# Patient Record
Sex: Male | Born: 2000 | Race: White | Hispanic: No | Marital: Married | Smoking: Current some day smoker
Health system: Southern US, Community
[De-identification: ages and names within clinical notes are randomized; demographics above are authoritative.]

## PROBLEM LIST (undated history)

## (undated) DIAGNOSIS — L409 Psoriasis, unspecified: Secondary | ICD-10-CM

## (undated) HISTORY — DX: Psoriasis, unspecified: L40.9

## (undated) HISTORY — PX: CIRCUMCISION: SUR203

---
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
## 2021-01-14 ENCOUNTER — Telehealth (INDEPENDENT_AMBULATORY_CARE_PROVIDER_SITE_OTHER): Payer: Self-pay | Admitting: "Endocrinology

## 2021-01-14 NOTE — Telephone Encounter (Signed)
  Who's calling (name and relationship to patient) :mom/ Mickeal Skinner   Best contact number:[phone]   Provider they see:Dr. Fransico Michael   Reason for call:mom called requesting a call back with a medical question asked to speak with clinic staff      PRESCRIPTION REFILL ONLY  Name of prescription:  Pharmacy:

## 2021-02-22 ENCOUNTER — Other Ambulatory Visit: Payer: Self-pay | Admitting: Nurse Practitioner

## 2021-02-22 ENCOUNTER — Other Ambulatory Visit: Payer: Self-pay

## 2021-02-22 ENCOUNTER — Ambulatory Visit
Admission: RE | Admit: 2021-02-22 | Discharge: 2021-02-22 | Disposition: A | Discharge status: Home / Self Care | Payer: No Typology Code available for payment source | Source: Ambulatory Visit | Attending: Nurse Practitioner | Admitting: Nurse Practitioner

## 2021-02-22 DIAGNOSIS — Z021 Encounter for pre-employment examination: Secondary | ICD-10-CM

## 2022-10-26 ENCOUNTER — Ambulatory Visit (INDEPENDENT_AMBULATORY_CARE_PROVIDER_SITE_OTHER): Payer: 59 | Admitting: Nurse Practitioner

## 2022-10-26 ENCOUNTER — Encounter: Payer: Self-pay | Admitting: Nurse Practitioner

## 2022-10-26 VITALS — BP 102/60 | HR 70 | Temp 98.7°F | Resp 16 | Ht 74.5 in | Wt 238.1 lb

## 2022-10-26 DIAGNOSIS — Z13220 Encounter for screening for lipoid disorders: Secondary | ICD-10-CM

## 2022-10-26 DIAGNOSIS — Z0000 Encounter for general adult medical examination without abnormal findings: Secondary | ICD-10-CM | POA: Insufficient documentation

## 2022-10-26 DIAGNOSIS — H6123 Impacted cerumen, bilateral: Secondary | ICD-10-CM | POA: Diagnosis not present

## 2022-10-26 DIAGNOSIS — E669 Obesity, unspecified: Secondary | ICD-10-CM | POA: Diagnosis not present

## 2022-10-26 DIAGNOSIS — L409 Psoriasis, unspecified: Secondary | ICD-10-CM | POA: Diagnosis not present

## 2022-10-26 NOTE — Assessment & Plan Note (Signed)
Do think this is secondary to muscle mass.  Patient is active with employment and exercise continue healthy lifestyle modifications

## 2022-10-26 NOTE — Assessment & Plan Note (Signed)
Historical diagnosis.  Does have been on several areas of his body.  Was on Humira and then switched to Cosentyx.  Used to be followed by [facility]. Ambulatory referral to dermatology placed today

## 2022-10-26 NOTE — Assessment & Plan Note (Signed)
Discussed age-appropriate immunizations and screening exams.  Did review patient's personal, surgical, social, family history.  Patient up-to-date on vaccinations.  He is unsure last tetanus shot inform patient if he gets bit or dirty cut it worked updated immediately.  Patient is too young for CRC screening or prostate cancer screening.  Patient was given information at discharge about preventative healthcare maintenance with anticipatory guidance.

## 2022-10-26 NOTE — Patient Instructions (Signed)
Nice to see you today I will be in touch with the labs once I have reviewed them I have made a referral to the dermatologist Follow up with me in 1 year for your next physical, sooner if you need me

## 2022-10-26 NOTE — Assessment & Plan Note (Addendum)
Verbal consent obtained.  Patient was prepped per office policy with cerumen softening eardrops.  A mixture of water and hydroperoxide were used in bilateral ears were irrigated.  Patient tolerated procedure well.  Bilateral impactions were dislodged.  Both TMs within normal limits after procedure

## 2022-10-26 NOTE — Progress Notes (Signed)
New Patient Office Visit  Subjective    Patient ID: David Hooper, male    DOB: [birth year]  Age: 22 y.o. MRN: [id number]  CC:  Chief Complaint  Patient presents with   Establish Care    HPI David Hooper presents to establish care Psorasis: was folowed by [facility] in [place]. Was on humira and then cosentyx. Has been off medications for a while   for complete physical and follow up of chronic conditions.  Immunizations: -Tetanus: Unsure -Influenza: Out of season -Shingles: Too young -Pneumonia: Too young  Diet: Fair diet. States that he eats 2-3 when he is at home. States varies at work. Snacks throughout the days. States that he will do coffee water soda and tea. Exercise:  5 times a week. States that he will do weight s and cardio. 45-60 mins   Eye exam: prn  Dental exam: needs updating   Colonoscopy: Too young, currently average risk Lung Cancer Screening: N/A  PSA: Too young, currently average risk  Sleep: works 4pm-3am. States that he will sleep 3-11am. Feels rested. Does snore   Outpatient Encounter Medications as of 10/26/2022  Medication Sig   diclofenac (VOLTAREN) 75 MG EC tablet Take 1 tablet (75 mg total) by mouth 2 (two) times daily. (Patient not taking: Reported on 05/08/2017)   No facility-administered encounter medications on file as of 10/26/2022.    Past Medical History:  Diagnosis Date   Psoriasis     Past Surgical History:  Procedure Laterality Date   CIRCUMCISION      Family History  Problem Relation Age of Onset   Diabetes Mother    Migraines Mother    Migraines Father    Anxiety disorder Father    Diabetes Brother    Bipolar disorder Paternal Aunt    Diabetes Maternal Grandmother    Migraines Other     Social History   Socioeconomic History   Marital status: Married    Spouse name: Toula Moos   Number of children: 1   Years of education: Not on file   Highest education level: Not on file  Occupational History    Not on file  Tobacco Use   Smoking status: Some Days    Types: Cigars   Smokeless tobacco: Never  Vaping Use   Vaping Use: Never used  Substance and Sexual Activity   Alcohol use: Yes    Comment: occ   Drug use: Never   Sexual activity: Yes    Birth control/protection: None  Other Topics Concern   Not on file  Social History Narrative   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  Respiratory:  Negative for shortness of breath.   Cardiovascular:  Negative for chest pain and leg swelling.  Gastrointestinal:  Negative for abdominal pain, blood in stool, constipation, diarrhea, nausea and vomiting.       BM daily   Genitourinary:  Negative for dysuria and hematuria.  Neurological:  Negative for tingling and headaches.  Psychiatric/Behavioral:  Negative for hallucinations and  suicidal ideas.         Objective    BP 102/60   Pulse 70   Temp 98.7 F (37.1 C)   Resp 16   Ht 6' 2.5" (1.892 m)   Wt 238 lb 2 oz (108 kg)   SpO2 98%   BMI 30.16 kg/m   Physical Exam Vitals and nursing note reviewed. Exam conducted with a chaperone present Tresa Endo Fiscal, CMA).  Constitutional:      Appearance: Normal appearance.  HENT:     Right Ear: Ear canal and external ear normal. There is impacted cerumen.     Left Ear: Ear canal and external ear normal. There is impacted cerumen.     Mouth/Throat:     Mouth: Mucous membranes are moist.     Pharynx: Oropharynx is clear.  Eyes:     Extraocular Movements: Extraocular movements intact.     Pupils: Pupils are equal, round, and  reactive to light.  Cardiovascular:     Rate and Rhythm: Normal rate and regular rhythm.     Pulses: Normal pulses.     Heart sounds: Normal heart sounds.  Pulmonary:     Effort: Pulmonary effort is normal.     Breath sounds: Normal breath sounds.  Abdominal:     General: Bowel sounds are normal. There is no distension.     Palpations: There is no mass.     Tenderness: There is no abdominal tenderness.     Hernia: No hernia is present. There is no hernia in the left inguinal area or right inguinal area.  Genitourinary:    Penis: Circumcised.      Testes: Normal.     Epididymis:     Right: Normal.     Left: Normal.    Musculoskeletal:     Right lower leg: No edema.     Left lower leg: No edema.  Lymphadenopathy:     Cervical: No cervical adenopathy.     Lower Body: No right inguinal adenopathy. No left inguinal adenopathy.  Skin:    General: Skin is warm.  Neurological:     General: No focal deficit present.     Mental Status: He is alert.     Deep Tendon Reflexes:     Reflex Scores:      Bicep reflexes are 2+ on the right side and 2+ on the left side.      Patellar reflexes are 2+ on the right side and 2+ on the left side.    Comments: Bilateral upper and lower extremity strength 5/5  Psychiatric:        Mood and Affect: Mood normal.        Behavior: Behavior normal.        Thought Content: Thought content normal.        Judgment: Judgment normal.         Assessment & Plan:   Problem List Items Addressed This Visit       Nervous and Auditory   Bilateral impacted cerumen    Verbal consent obtained.  Patient was prepped per office policy with cerumen softening eardrops.  A mixture of water and hydroperoxide were used in bilateral ears were irrigated.  Patient tolerated procedure well.      Relevant Orders   Ear Lavage     Musculoskeletal and Integument   Psoriasis    Historical diagnosis.  Does have been on several areas of his body.  Was on Humira and  then switched to Cosentyx.  Used to be followed by [facility]. Ambulatory referral to dermatology placed today      Relevant Orders   Ambulatory referral to Dermatology     Other   Preventative health care - Primary    Discussed age-appropriate immunizations and screening exams.  Did review patient's personal, surgical, social, family history.  Patient up-to-date on vaccinations.  He is unsure last tetanus shot inform patient if he gets bit or dirty cut it worked updated immediately.  Patient is too young for CRC screening or prostate cancer screening.  Patient was given information at discharge about preventative healthcare maintenance with anticipatory guidance.      Relevant Orders   CBC   Comprehensive metabolic panel   TSH   Obesity (BMI 30-39.9)    Do think this is secondary to muscle mass.  Patient is active with employment and exercise continue healthy lifestyle modifications      Relevant Orders   Hemoglobin A1c   Lipid panel   Other Visit Diagnoses     Screening for lipid disorders       Relevant Orders   Lipid panel       Return in about 1 year (around 10/26/2023) for CPE and Labs.   Audria Nine, NP

## 2022-10-27 LAB — COMPREHENSIVE METABOLIC PANEL
ALT: 35 U/L (ref 0–53)
AST: 23 U/L (ref 0–37)
Albumin: 4.5 g/dL (ref 3.5–5.2)
Alkaline Phosphatase: 80 U/L (ref 39–117)
BUN: 10 mg/dL (ref 6–23)
CO2: 30 mEq/L (ref 19–32)
Calcium: 9.6 mg/dL (ref 8.4–10.5)
Chloride: 100 mEq/L (ref 96–112)
Creatinine, Ser: 0.87 mg/dL (ref 0.40–1.50)
GFR: 122.41 mL/min (ref >60.00)
Glucose, Bld: 82 mg/dL (ref 70–99)
Potassium: 4.1 mEq/L (ref 3.5–5.1)
Sodium: 139 mEq/L (ref 135–145)
Total Bilirubin: 1.3 mg/dL — ABNORMAL HIGH (ref 0.2–1.2)
Total Protein: 7.4 g/dL (ref 6.0–8.3)

## 2022-10-27 LAB — LIPID PANEL
Cholesterol: 172 mg/dL (ref 0–200)
HDL: 31.7 mg/dL — ABNORMAL LOW (ref >39.00)
LDL Cholesterol: 107 mg/dL — ABNORMAL HIGH (ref 0–99)
NonHDL: 140.28
Total CHOL/HDL Ratio: 5
Triglycerides: 167 mg/dL — ABNORMAL HIGH (ref 0.0–149.0)
VLDL: 33.4 mg/dL (ref 0.0–40.0)

## 2022-10-27 LAB — CBC
HCT: 40.7 % (ref 39.0–52.0)
Hemoglobin: 14 g/dL (ref 13.0–17.0)
MCHC: 34.4 g/dL (ref 30.0–36.0)
MCV: 81.2 fl (ref 78.0–100.0)
Platelets: 280 10*3/uL (ref 150.0–400.0)
RBC: 5.01 Mil/uL (ref 4.22–5.81)
RDW: 13.8 % (ref 11.5–15.5)
WBC: 7.6 10*3/uL (ref 4.0–10.5)

## 2022-10-27 LAB — HEMOGLOBIN A1C: Hgb A1c MFr Bld: 5.4 % (ref 4.6–6.5)

## 2022-10-27 LAB — TSH: TSH: 1.28 u[IU]/mL (ref 0.35–5.50)

## 2022-10-31 ENCOUNTER — Encounter: Payer: Self-pay | Admitting: *Deleted

## 2022-12-24 IMAGING — CR DG CHEST 1V
1 series · 1 of 1 positions shown · non-contrast
Comparison: Chest radiographs 08/04/2003.

CLINICAL DATA: Pre-[REDACTED] screening examination.

EXAM:
CHEST  1 VIEW

[w chest pa]
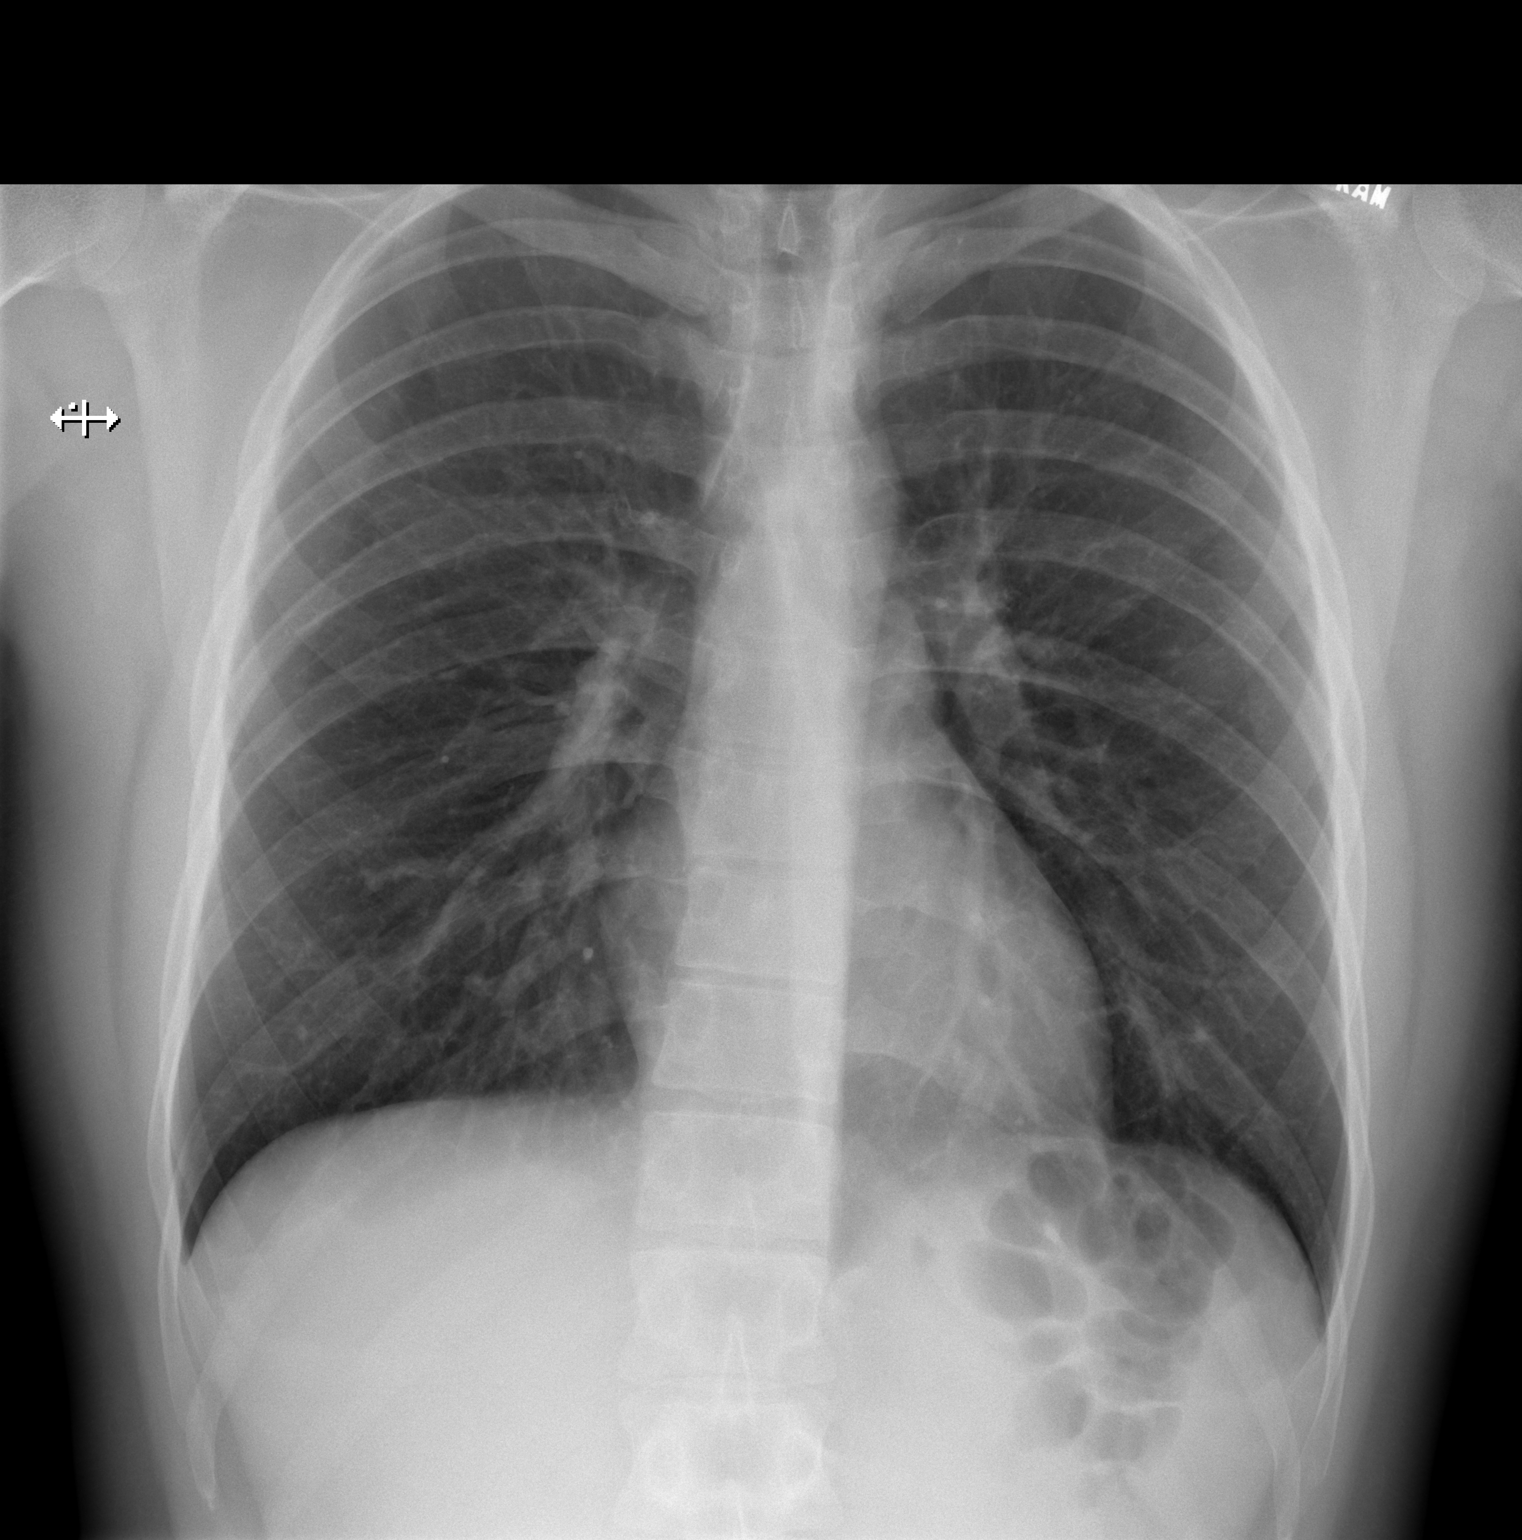

[1 of 1 positions shown; findings below may reference images not displayed]

FINDINGS: Heart size within normal limits. No appreciable airspace
consolidation. No evidence of pleural effusion or pneumothorax. No
acute bony abnormality identified.
IMPRESSION: No evidence of active cardiopulmonary disease.

## 2023-05-13 ENCOUNTER — Ambulatory Visit
Admission: EM | Admit: 2023-05-13 | Discharge: 2023-05-13 | Disposition: A | Discharge status: Home / Self Care | Payer: 59

## 2023-05-13 DIAGNOSIS — U071 COVID-19: Secondary | ICD-10-CM

## 2023-05-13 DIAGNOSIS — J111 Influenza due to unidentified influenza virus with other respiratory manifestations: Secondary | ICD-10-CM | POA: Diagnosis not present

## 2023-05-13 LAB — POC COVID19/FLU A&B COMBO
Covid Antigen, POC: POSITIVE — AB
Influenza A Antigen, POC: POSITIVE — AB
Influenza B Antigen, POC: POSITIVE — AB

## 2023-05-13 MED ORDER — PROMETHAZINE-DM 6.25-15 MG/5ML PO SYRP
5.0000 mL | ORAL_SOLUTION | Freq: Four times a day (QID) | ORAL | 0 refills | Status: DC | PRN
Start: 2023-05-13 — End: 2024-04-23

## 2023-05-13 MED ORDER — IPRATROPIUM BROMIDE 0.03 % NA SOLN: 2.0000 | Freq: Two times a day (BID) | NASAL | 0 refills | Status: DC

## 2023-05-13 NOTE — Discharge Instructions (Signed)
As we discussed, you did test positive for flu A, flu B, COVID-19.  These are viruses that typically your body will fight on their own.  If you are interested in starting Paxlovid you would need to let us know within 5 days of your symptoms.  Make sure you rest and drink plenty of fluid.  We are going to treat your symptoms.  Use Promethazine DM for cough.  This will make you sleepy so do not drive or drink alcohol taking it.  Use ipratropium nasal spray to help with the congestion.  I also recommend nasal saline and sinus rinses as well as pushing fluids.  If you are not feeling better within a few days or if anything worsens you should be seen immediately.

## 2023-05-13 NOTE — ED Provider Notes (Signed)
David Hooper    CSN: [id number] Arrival date & time: 05/13/23  1152      History   Chief Complaint Chief Complaint  Patient presents with   Cough   Sore Throat    HPI David Hooper is a 22 y.o. male.   Patient presents today with a 2-day history of URI symptoms.  Reports rhinorrhea, congestion, cough, sore throat.  Denies any chest pain, shortness of breath, fever, nausea, vomiting.  Denies any known sick contacts but was recently around many family members at Thanksgiving.  He has had COVID several years ago.  He has not had influenza vaccination this year.  He had initial COVID-19 vaccination but has not had most recent booster.  He has been taking DayQuil without improvement of symptoms.  Denies any recent antibiotics or steroids.  He does have a history of psoriasis and is on Cosentyx but denies any additional past medical history.  Denies history of allergies, asthma, COPD.  He smokes socially but infrequently.    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 Authorizing Provider  COSENTYX SENSOREADY, 300 MG, 150 MG/ML SOAJ every 30 (thirty) days. 04/18/23  Yes [provider]  ipratropium (ATROVENT) 0.03 % nasal spray Place 2 sprays into both nostrils every 12 (twelve) hours. 05/13/23  Yes Siniya Lichty K, PA-C  promethazine-dextromethorphan (PROMETHAZINE-DM) 6.25-15 MG/5ML syrup Take 5 mLs by mouth 4 (four) times daily as needed for cough. 05/13/23  Yes Annalyse Langlais, Noberto Retort, PA-C   Secukinumab (COSENTYX [place])     [provider]    Family History Family History  Problem Relation Age of Onset   Diabetes Mother    Migraines Mother    Migraines Father    Anxiety disorder Father    Diabetes Brother    Bipolar disorder Paternal Aunt    Diabetes Maternal Grandmother    Migraines Other     Social History Social History   Tobacco Use   Smoking status: Some Days    Types: Cigars   Smokeless tobacco: Never  Vaping Use   Vaping status: Never Used  Substance Use Topics   Alcohol use: Yes    Comment: Occassionally.   Drug use: Never     Allergies   Patient has no known allergies.   Review of Systems Review of Systems  Constitutional:  Positive for activity change. Negative for appetite change, fatigue and fever.  HENT:  Positive for congestion, postnasal drip and sore throat. Negative for sinus pressure and sneezing.   Respiratory:  Positive for cough. Negative for shortness of breath.   Cardiovascular:  Negative for chest pain.  Gastrointestinal:  Negative for abdominal pain, diarrhea, nausea and vomiting.     Physical Exam Triage Vital Signs ED Triage Vitals  Encounter Vitals Group     BP 05/13/23 1445 136/66     Systolic BP Percentile --      Diastolic BP Percentile --  Pulse Rate 05/13/23 1445 70     Resp 05/13/23 1445 18     Temp 05/13/23 1445 97.9 F (36.6 C)     Temp Source 05/13/23 1445 Oral     SpO2 05/13/23 1445 99 %     Weight 05/13/23 1443 235 lb (106.6 kg)     Height 05/13/23 1443 6\' 4"  (1.93 m)     Head Circumference --      Peak Flow --      Pain Score 05/13/23 1440 0     Pain Loc --      Pain Education --      Exclude from Growth Chart --    No data found.  Updated Vital Signs BP 136/66 (BP Location: Left Arm)   Pulse 70   Temp 97.9 F (36.6 C) (Oral)   Resp 18   Ht 6\' 4"  (1.93 m)   Wt 235 lb (106.6 kg)   SpO2 99%   BMI 28.61 kg/m   Visual Acuity Right Eye Distance:   Left Eye Distance:    Bilateral Distance:    Right Eye Near:   Left Eye Near:    Bilateral Near:     Physical Exam Vitals reviewed.  Constitutional:      General: He is awake.     Appearance: Normal appearance. He is well-developed. He is not ill-appearing.     Comments: Very pleasant male appears stated age in no acute distress sitting comfortably in exam room  HENT:     Head: Normocephalic and atraumatic.     Right Ear: Tympanic membrane, ear canal and external ear normal. Tympanic membrane is not erythematous or bulging.     Left Ear: Tympanic membrane, ear canal and external ear normal. Tympanic membrane is not erythematous or bulging.     Nose: Nose normal.     Mouth/Throat:     Pharynx: Uvula midline. Posterior oropharyngeal erythema present. No oropharyngeal exudate or uvula swelling.  Cardiovascular:     Rate and Rhythm: Normal rate and regular rhythm.     Heart sounds: Normal heart sounds, S1 normal and S2 normal. No murmur heard. Pulmonary:     Effort: Pulmonary effort is normal. No accessory muscle usage or respiratory distress.     Breath sounds: Normal breath sounds. No stridor. No wheezing, rhonchi or rales.     Comments: Clear to auscultation bilaterally Abdominal:     General: Bowel sounds are normal.     Palpations: Abdomen is soft.     Tenderness: There is no abdominal tenderness.  Neurological:     Mental Status: He is alert.  Psychiatric:        Behavior: Behavior is cooperative.      UC Treatments / Results  Labs (all labs ordered are listed, but only abnormal results are displayed) Labs Reviewed  POC COVID19/FLU A&B COMBO - Abnormal; Notable for the following components:      Result Value   Influenza A Antigen, POC Positive (*)    Influenza B Antigen, POC Positive (*)    Covid Antigen, POC Positive (*)    All other components within normal limits    EKG   Radiology No results found.  Procedures Procedures (including critical care time)  Medications Ordered  in UC Medications - No data to display  Initial Impression / Assessment and Plan / UC Course  I have reviewed the triage vital signs and the nursing notes.  Pertinent labs & imaging results that were available during my  care of the patient were reviewed by me and considered in my medical decision making (see chart for details).     Patient is well-appearing, afebrile, nontoxic, nontachycardic.  No evidence of acute infection on physical exam that would warrant initiation of antibiotics.  Chest x-ray was deferred as he has no adventitious lung sounds on exam his oxygen saturation was 99%.  Given short duration of symptoms COVID/flu testing was obtained.  Initially this was positive for influenza A, influenza B, COVID-19.  The control was present but we did retest him to confirm the results which again showed influenza A, influenza B, COVID-19.  We discussed potential utility of treatment with Tamiflu and Paxlovid given he does take immune modulating medication for psoriasis but after risks and benefits discussion patient preferred to defer starting these medications.  Discussed that if he is interested in starting Paxlovid within the next few days (within 5 days of symptom onset) he can contact us and we can consider calling in this prescription.  He does not require any dose adjustment of his Cosentyx with Paxlovid.  Will treat symptomatically in the meantime with Promethazine DM.  We discussed that this can be sedating and he is not drive drink alcohol with taking it.  He was also given ipratropium nasal spray for additional symptom relief as well as instruction to use nasal saline sinus rinses for additional symptoms.  He is to rest and drink plenty fluid.  Discussed that if his symptoms are improving within a week or if he has any worsening symptoms he needs to be seen immediately.  Strict return precautions given.  Work excuse note provided.  Final Clinical Impressions(s) / UC Diagnoses   Final  diagnoses:  Influenza  COVID-19     Discharge Instructions      As we discussed, you did test positive for flu A, flu B, COVID-19.  These are viruses that typically your body will fight on their own.  If you are interested in starting Paxlovid you would need to let us know within 5 days of your symptoms.  Make sure you rest and drink plenty of fluid.  We are going to treat your symptoms.  Use Promethazine DM for cough.  This will make you sleepy so do not drive or drink alcohol taking it.  Use ipratropium nasal spray to help with the congestion.  I also recommend nasal saline and sinus rinses as well as pushing fluids.  If you are not feeling better within a few days or if anything worsens you should be seen immediately.     ED Prescriptions     Medication Sig Dispense Auth. Provider   promethazine-dextromethorphan (PROMETHAZINE-DM) 6.25-15 MG/5ML syrup Take 5 mLs by mouth 4 (four) times daily as needed for cough. 118 mL Jeannemarie Sawaya K, PA-C   ipratropium (ATROVENT) 0.03 % nasal spray Place 2 sprays into both nostrils every 12 (twelve) hours. 30 mL Dajohn Ellender K, PA-C      PDMP not reviewed this encounter.   Jeani Hawking, PA-C 05/13/23 1627

## 2023-05-13 NOTE — ED Triage Notes (Signed)
"  This started with runny nose with cough about 2 days ago, went to work last night woke up today with sore throat, congestion, but I feel super cold". No flu vaccine or COVID19 vaccine. No fever (known).

## 2023-05-14 ENCOUNTER — Ambulatory Visit: Payer: 59

## 2023-07-12 ENCOUNTER — Ambulatory Visit (INDEPENDENT_AMBULATORY_CARE_PROVIDER_SITE_OTHER): Payer: 59

## 2023-07-12 ENCOUNTER — Ambulatory Visit
Admission: RE | Admit: 2023-07-12 | Discharge: 2023-07-12 | Disposition: A | Discharge status: Home / Self Care | Payer: 59 | Source: Ambulatory Visit | Attending: Emergency Medicine | Admitting: Emergency Medicine

## 2023-07-12 VITALS — BP 109/65 | HR 65 | Temp 97.8°F | Resp 18

## 2023-07-12 DIAGNOSIS — S90212A Contusion of left great toe with damage to nail, initial encounter: Secondary | ICD-10-CM

## 2023-07-12 DIAGNOSIS — M79675 Pain in left toe(s): Secondary | ICD-10-CM

## 2023-07-12 DIAGNOSIS — S92425B Nondisplaced fracture of distal phalanx of left great toe, initial encounter for open fracture: Secondary | ICD-10-CM

## 2023-07-12 MED ORDER — CEPHALEXIN 500 MG PO CAPS: 500.0000 mg | ORAL_CAPSULE | Freq: Three times a day (TID) | ORAL | 0 refills | Status: DC

## 2023-07-12 NOTE — ED Triage Notes (Signed)
Patient to Urgent Care with complaints of left sided, big toe pain. Reports that he tripped and fell yesterday walking up stairs yesterday morning. States he jammed his toe into the step.   Now having some pain and significant bruising to his left, big toe.   No hx of previous injury.

## 2023-07-12 NOTE — Discharge Instructions (Addendum)
[facility] [address], [place], [place] [address] Phone: [phone]  Take the cephalexin as directed.  Wear your walking boot as directed.  Buddy tape your toes.  Ibuprofen as directed.  Rest and elevate your foot.  Follow-up with a foot doctor such as [facility].

## 2023-07-12 NOTE — ED Provider Notes (Addendum)
David Hooper    CSN: [id number] Arrival date & time: 07/12/23  1420      History   Chief Complaint Chief Complaint  Patient presents with   Foot Injury    My husband, David Hooper, fell on his big toe yesterday. It is now bruised, he is having trouble walking in and he states that it "hurts a lot". - Entered by patient    HPI David Hooper is a 23 y.o. male.  Patient presents with pain, swelling, bruising of his left great toe after he hit it on steps yesterday.  No OTC medication taken today.  He took Tylenol yesterday.  No history of injury to this area in the past.  No numbness or weakness.  He is able to move his toe and ambulate without difficulty.  The history is provided by the patient and medical records.    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 Authorizing Provider  cephALEXin (KEFLEX) 500 MG capsule Take 1 capsule (500 mg total) by mouth 3 (three) times daily. 07/12/23  Yes Mickie Bail, NP  COSENTYX SENSOREADY, 300 MG, 150 MG/ML SOAJ every 30 (thirty) days. 04/18/23   [provider]  ipratropium (ATROVENT) 0.03 % nasal spray Place 2 sprays into both nostrils every 12 (twelve) hours. Patient not taking: Reported on 07/12/2023 05/13/23   Raspet, Noberto Retort, PA-C  promethazine-dextromethorphan (PROMETHAZINE-DM) 6.25-15 MG/5ML syrup Take 5 mLs by mouth 4 (four) times daily as needed for cough. Patient not taking: Reported on 07/12/2023 05/13/23   Raspet, Noberto Retort, PA-C  Secukinumab  (COSENTYX [place])     [provider]    Family History Family History  Problem Relation Age of Onset   Diabetes Mother    Migraines Mother    Migraines Father    Anxiety disorder Father    Diabetes Brother    Bipolar disorder Paternal Aunt    Diabetes Maternal Grandmother    Migraines Other     Social History Social History   Tobacco Use   Smoking status: Some Days    Types: Cigars   Smokeless tobacco: Never  Vaping Use   Vaping status: Never Used  Substance Use Topics   Alcohol use: Yes    Comment: Occassionally.   Drug use: Never     Allergies   Patient has no known allergies.   Review of Systems Review of Systems  Musculoskeletal:  Positive for arthralgias and joint swelling. Negative for gait problem.  Skin:  Positive for color change. Negative for wound.  Neurological:  Negative for weakness and numbness.     Physical Exam Triage Vital Signs ED Triage Vitals  Encounter Vitals Group     BP 07/12/23 1432 109/65     Systolic BP Percentile --      Diastolic BP Percentile --      Pulse Rate 07/12/23 1432 65     Resp 07/12/23 1432 18     Temp  07/12/23 1432 97.8 F (36.6 C)     Temp src --      SpO2 07/12/23 1432 96 %     Weight --      Height --      Head Circumference --      Peak Flow --      Pain Score 07/12/23 1427 2     Pain Loc --      Pain Education --      Exclude from Growth Chart --    No data found.  Updated Vital Signs BP 109/65   Pulse 65   Temp 97.8 F (36.6 C)   Resp 18   SpO2 96%   Visual Acuity Right Eye Distance:   Left Eye Distance:   Bilateral Distance:    Right Eye Near:   Left Eye Near:    Bilateral Near:     Physical Exam Constitutional:      General: He is not in acute distress. HENT:     Mouth/Throat:     Mouth: Mucous membranes are moist.  Cardiovascular:     Rate and Rhythm: Normal rate and regular rhythm.  Pulmonary:     Effort: Pulmonary effort is normal. No respiratory distress.   Musculoskeletal:        General: Swelling and tenderness present. No deformity. Normal range of motion.     Comments: Left great toe tenderness and mild edema. Dried blood and ecchymosis at base of nail.  Subungual hematoma of approximately 25% of nail.  FROM, sensation intact.  2+ pedal pulse.   Skin:    General: Skin is warm and dry.     Capillary Refill: Capillary refill takes less than 2 seconds.  Neurological:     General: No focal deficit present.     Mental Status: He is alert and oriented to person, place, and time.     Sensory: No sensory deficit.     Motor: No weakness.     Gait: Gait normal.      UC Treatments / Results  Labs (all labs ordered are listed, but only abnormal results are displayed) Labs Reviewed - No data to display  EKG   Radiology DG Toe Great Left Result Date: 07/12/2023 CLINICAL DATA:  Pain after injury yesterday. EXAM: LEFT GREAT TOE COMPARISON:  None Available. FINDINGS: Transverse fracture of the great toe distal phalanx. No significant displacement. No intra-articular involvement. Proximal digit is intact. Mild soft tissue edema. IMPRESSION: Transverse nondisplaced great toe distal phalanx fracture. Electronically Signed   By: Narda Rutherford M.D.   On: 07/12/2023 15:56    Procedures Procedures (including critical care time)  Medications Ordered in UC Medications - No data to display  Initial Impression / Assessment and Plan / UC Course  I have reviewed the triage vital signs and the nursing notes.  Pertinent labs & imaging results that were available during my care of the patient were reviewed by me and considered in my medical decision making (see chart for details).   Left great toe pain.  Xray shows nondisplaced fracture of the distal phalanx.  Because patient has dried blood at the base of his nail, treating with cephalexin.  Patient declines postop shoe.  He has a walking boot at home and prefers to wear that.  Discussed rest,  elevation, ibuprofen, ice packs.  Instructed him to follow up with a podiatrist.  Contact information for [facility] provided.  He agrees to plan of care.  Final Clinical Impressions(s) / UC Diagnoses   Final diagnoses:  Pain of left great toe  Open nondisplaced fracture of distal phalanx of left great toe, initial encounter     Discharge Instructions      [facility] [address] [place], [place] [address] Phone: [phone]  Take the cephalexin as directed.  Wear your walking boot as directed.  Buddy tape your toes.  Ibuprofen as directed.  Rest and elevate your foot.  Follow-up with a foot doctor such as [facility].      ED Prescriptions     Medication Sig Dispense Auth. Provider   cephALEXin (KEFLEX) 500 MG capsule Take 1 capsule (500 mg total) by mouth 3 (three) times daily. 21 capsule Mickie Bail, NP      PDMP not reviewed this encounter.   Mickie Bail, NP 07/12/23 1616    Mickie Bail, NP 07/12/23 [phone]

## 2023-07-24 ENCOUNTER — Ambulatory Visit (INDEPENDENT_AMBULATORY_CARE_PROVIDER_SITE_OTHER): Payer: 59

## 2023-07-24 ENCOUNTER — Ambulatory Visit: Payer: 59 | Admitting: Podiatry

## 2023-07-24 ENCOUNTER — Encounter: Payer: Self-pay | Admitting: Podiatry

## 2023-07-24 DIAGNOSIS — S92422A Displaced fracture of distal phalanx of left great toe, initial encounter for closed fracture: Secondary | ICD-10-CM | POA: Diagnosis not present

## 2023-07-24 DIAGNOSIS — S99922A Unspecified injury of left foot, initial encounter: Secondary | ICD-10-CM

## 2023-07-24 NOTE — Progress Notes (Signed)
   Chief Complaint  Patient presents with   Toe Injury    Patient states went to urgent care 2 weeks ago and they told the patient that he broke his Left hallux . No medication for, patient can walk and run on it. But it will hurt if someone steps on it    HPI: 23 y.o. male presenting today as a new patient referral from the urgent care.  Patient sustained a toe fracture to the left great toe distal phalanx 07/12/2023 when he fell down the stairs at his home.  Went to the urgent care where x-rays were taken positive for nondisplaced transverse fracture of the distal phalanx left great toe.  He has minimal pain.  He is able to ambulate just fine in tennis shoes.  He is currently on light duty for work.  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  Past Medical History:  Diagnosis Date   Psoriasis     Past Surgical History:  Procedure Laterality Date   CIRCUMCISION      No Known Allergies   Physical Exam: General: The patient is alert and oriented x3 in no acute distress.  Dermatology: Skin is warm, dry and supple bilateral lower extremities.  There is some very mild subungual hematoma underlying the left hallux nail plate but overall the nail plate is well adhered and should resolve uneventfully  Vascular: Palpable pedal pulses bilaterally. Capillary refill within normal limits.  No appreciable edema.  No erythema.  Neurological: Grossly intact via light touch  Musculoskeletal Exam: No pedal deformities noted.  There is some mild tenderness to palpation to the distal phalanx of the toe.  Radiographic Exam LT foot 07/24/2023:  Normal osseous mineralization. Joint spaces preserved.  Closed nondisplaced transverse fracture noted to the base of the distal phalanx left great toe  Assessment/Plan of Care: 1.  Transverse fracture distal phalanx left great toe; closed nondisplaced, initial encounter  -Patient evaluated.  X-rays reviewed -Currently the toe is minimally symptomatic.  It  should resolve and heal uneventfully -Patient is able to ambulate just fine in tennis shoes and go about his normal activities.  He has been light duty for the past 2 weeks -Patient may now resume full activity no restrictions beginning 07/25/2023 -Return to clinic as needed     Dot Gazella, DPM [facility]  Dr. Dot Gazella, DPM    [address] [place], [place] [address]                Office [phone]  Fax [phone]

## 2024-04-19 ENCOUNTER — Ambulatory Visit: Payer: Self-pay

## 2024-04-19 NOTE — Telephone Encounter (Signed)
 Noted

## 2024-04-19 NOTE — Telephone Encounter (Signed)
 FYI Only or Action Required?: FYI only for provider: appointment scheduled on 11/11.  Patient was last seen in primary care on 10/26/2022 by Wendee Lynwood HERO, NP.  Called Nurse Triage reporting Shoulder Pain.  Symptoms began several months ago.  Symptoms are: gradually worsening.  Triage Disposition: See PCP Within 2 Weeks  Patient/caregiver understands and will follow disposition?: Yes     Copied from CRM [id number]. Topic: Clinical - Red Word Triage >> Apr 19, 2024  1:27 PM Robinson H wrote: [facility] that prompted transfer to Nurse Triage: Shoulder pain both shoulders more in left, have had it for 6-8 months but getting worse      Reason for Disposition  Shoulder pain is a chronic symptom (recurrent or ongoing AND present > 4 weeks)  Answer Assessment - Initial Assessment Questions 1. ONSET: When did the pain start?     6-8 months ago 2. LOCATION: Where is the pain located?     Bilateral shoulder pain, left worse than right  3. PAIN: How bad is the pain? (Scale 1-10; or mild, moderate, severe)     Mild pain but goes up to moderate when lifting  4. WORK OR EXERCISE: Has there been any recent work or exercise that involved this part of the body?     Does weightlifting regularly  5. CAUSE: What do you think is causing the shoulder pain?     Unsure 6. OTHER SYMPTOMS: Do you have any other symptoms? (e.g., neck pain, swelling, rash, fever, numbness, weakness)     No  Protocols used: Shoulder Pain-A-AH

## 2024-04-23 ENCOUNTER — Ambulatory Visit: Admitting: Family Medicine

## 2024-04-23 ENCOUNTER — Encounter: Payer: Self-pay | Admitting: Family Medicine

## 2024-04-23 VITALS — BP 100/74 | HR 65 | Temp 97.9°F | Ht 76.0 in | Wt 244.0 lb

## 2024-04-23 DIAGNOSIS — G8929 Other chronic pain: Secondary | ICD-10-CM | POA: Insufficient documentation

## 2024-04-23 DIAGNOSIS — M25511 Pain in right shoulder: Secondary | ICD-10-CM | POA: Diagnosis not present

## 2024-04-23 DIAGNOSIS — L409 Psoriasis, unspecified: Secondary | ICD-10-CM | POA: Diagnosis not present

## 2024-04-23 DIAGNOSIS — Z23 Encounter for immunization: Secondary | ICD-10-CM | POA: Diagnosis not present

## 2024-04-23 DIAGNOSIS — M25512 Pain in left shoulder: Secondary | ICD-10-CM

## 2024-04-23 MED ORDER — NAPROXEN 500 MG PO TABS: 500.0000 mg | ORAL_TABLET | Freq: Two times a day (BID) | ORAL | 0 refills | Status: AC

## 2024-04-23 NOTE — Patient Instructions (Addendum)
 Flu shot today  I think you may have rotator cuff tendinopathy Take naprosyn 500mg  twice daily with meals for 1 week then as needed for inflammation/pain.  Do exercises provided today I will also refer you to physical therapy for shoulder pain.  If not improving, return to see Dr Watt sports medicine.

## 2024-04-23 NOTE — Assessment & Plan Note (Signed)
 Chronic issue, present for 6+ months.  Exam suspicious for rotator cuff tendinopathy L>R. Not consistent with shoulder bursitis or glenohumeral tear.  Rec naprosyn course, ice/heat prn, provided with exercises from [facility] [place] [facility] pt advisor on RTC injury.  Recommend limit weight lifting until after physical therapy.  Given chronicity, refer to outpatient PT.  If no improvement, rec f/u with sports medicine Dr Watt.

## 2024-04-23 NOTE — Assessment & Plan Note (Addendum)
 Sees Lupton on Cosentyx with good control. Current symptoms not consistent with psoriatic arthritis.

## 2024-04-23 NOTE — Progress Notes (Signed)
 Ph: [phone] Fax: [phone]   Patient ID: David Hooper, male    DOB: Mar 29, 2001, 23 y.o.   MRN: [id number]  This visit was conducted in person.  BP 100/74   Pulse 65   Temp 97.9 F (36.6 C) (Oral)   Ht 6' 4 (1.93 m)   Wt 244 lb (110.7 kg)   SpO2 96%   BMI 29.70 kg/m   BP Readings from Last 3 Encounters:  04/23/24 100/74  07/12/23 109/65  05/13/23 136/66   CC: shoulder pain  Subjective:   HPI: David Hooper is a 23 y.o. male presenting on 04/23/2024 for Shoulder Pain (Pt CC is Bilateral shoulder pain x 6-8 mths. Left shoulder is worse than Right shoulder. Pt states he does weight lift. Pain Scale is 4. Does have some difficulty with ROM.)   6-8 months of left > right shoulder pain without inciting trauma/injury or fall noted.  Feels ROM affected. Starts noticing stiffness with raising L shoulder past 90 degrees. Points to anterior shoulder with radiation to back. Currently pain is 0/10, at its worst 6/10.  He does regularly weight lift, pain present at 30lb weight - even with cable exercises.  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  R handed.  No redness or swelling or warmth noted to joints, no hand pain.   Notes some midline occipital neck pain as well as lower back pain - he's been seeing chiropractor with benefit.   Hasn't tried anything medications or topical or ice/heat for this.   No shooting pain down arms, numbness/tingling, weakness.   Psoriasis on Cosentyx followed by Dr Ninetta at [facility].      Relevant past medical, surgical, family and social history reviewed and updated as indicated. Interim medical history since our last visit reviewed. Allergies and medications reviewed and updated. Outpatient Medications Prior to Visit  Medication Sig Dispense Refill   COSENTYX SENSOREADY, 300 MG, 150 MG/ML SOAJ every 30 (thirty) days.     cephALEXin  (KEFLEX ) 500 MG capsule Take 1 capsule (500 mg total) by mouth 3  (three) times daily. (Patient not taking: Reported on 04/23/2024) 21 capsule 0   ipratropium (ATROVENT ) 0.03 % nasal spray Place 2 sprays into both nostrils every 12 (twelve) hours. (Patient not taking: Reported on 04/23/2024) 30 mL 0   promethazine -dextromethorphan (PROMETHAZINE -DM) 6.25-15 MG/5ML syrup Take 5 mLs by mouth 4 (four) times daily as needed for cough. (Patient not taking: Reported on 04/23/2024) 118 mL 0   Secukinumab (COSENTYX [place])  (Patient not taking: Reported on 04/23/2024)     No facility-administered medications prior to visit.     Per HPI unless specifically indicated in ROS section below Review of Systems  Objective:  BP 100/74   Pulse 65   Temp 97.9 F (36.6 C) (Oral)   Ht 6' 4 (1.93 m)   Wt 244 lb (110.7 kg)   SpO2 96%   BMI 29.70 kg/m   Wt Readings from Last 3 Encounters:  04/23/24 244 lb (110.7 kg)  05/13/23 235 lb (106.6 kg)  10/26/22 238 lb 2 oz (108 kg)      Physical Exam Vitals and nursing note reviewed.  Constitutional:      Appearance: Normal appearance. He is not ill-appearing.  Musculoskeletal:        General: Tenderness present. No swelling.     Comments:  FROM at cervical neck without pain  Bilateral shoulder exam: No deformity of shoulders on inspection. Discomfort to palpation  anterior and superior shoulder on left, no pain with palpation of shoulder bursa bilaterally FROM in abduction and forward flexion, discomfort on left with both active and passive abduction past 90 degrees. + pain with testing SITS in ext/int rotation on right. + pain with empty can sign on left. + pain with Speed test on left. Discomfort with impingement testing on left. No significant pain with crossover test. No significant pain or crepitus with rotation of humeral head in GH joint.   Skin:    General: Skin is warm and dry.     Findings: No rash.  Neurological:     Mental Status: He is alert.  Psychiatric:        Mood and Affect: Mood normal.         Behavior: Behavior normal.        Assessment & Plan:   Problem List Items Addressed This Visit     Psoriasis   Sees Lupton on Cosentyx with good control. Current symptoms not consistent with psoriatic arthritis.       Chronic pain of both shoulders - Primary   Chronic issue, present for 6+ months.  Exam suspicious for rotator cuff tendinopathy L>R. Not consistent with shoulder bursitis or glenohumeral tear.  Rec naprosyn course, ice/heat prn, provided with exercises from [facility] pt advisor on RTC injury.  Recommend limit weight lifting until after physical therapy.  Given chronicity, refer to outpatient PT.  If no improvement, rec f/u with sports medicine Dr Watt.       Relevant Medications   naproxen (NAPROSYN) 500 MG tablet   Other Relevant Orders   Ambulatory referral to Physical Therapy   Other Visit Diagnoses       Encounter for immunization       Relevant Orders   Flu vaccine trivalent PF, 6mos and older(Flulaval,Afluria,Fluarix,Fluzone) (Completed)        Meds ordered this encounter  Medications   naproxen (NAPROSYN) 500 MG tablet    Sig: Take 1 tablet (500 mg total) by mouth 2 (two) times daily with a meal.    Dispense:  30 tablet    Refill:  0    Orders Placed This Encounter  Procedures   Flu vaccine trivalent PF, 6mos and older(Flulaval,Afluria,Fluarix,Fluzone)   Ambulatory referral to Physical Therapy    Referral Priority:   Routine    Referral Type:   Physical Medicine    Referral Reason:   Specialty Services Required    Requested Specialty:   Physical Therapy    Number of Visits Requested:   1    Patient Instructions  Flu shot today  I think you may have rotator cuff tendinopathy Take naprosyn 500mg  twice daily with meals for 1 week then as needed for inflammation/pain.  Do exercises provided today I will also refer you to physical therapy for shoulder pain.  If not improving, return to see Dr Watt sports medicine.   Follow up  plan: Return if symptoms worsen or fail to improve.  Anton Blas, MD

## 2024-05-14 ENCOUNTER — Encounter: Payer: Self-pay | Admitting: Physical Therapy

## 2024-05-14 ENCOUNTER — Ambulatory Visit: Admitting: Physical Therapy

## 2024-05-14 DIAGNOSIS — M25512 Pain in left shoulder: Secondary | ICD-10-CM | POA: Diagnosis not present

## 2024-05-14 DIAGNOSIS — G8929 Other chronic pain: Secondary | ICD-10-CM

## 2024-05-14 DIAGNOSIS — M25511 Pain in right shoulder: Secondary | ICD-10-CM

## 2024-05-14 DIAGNOSIS — R293 Abnormal posture: Secondary | ICD-10-CM

## 2024-05-14 DIAGNOSIS — M6281 Muscle weakness (generalized): Secondary | ICD-10-CM

## 2024-05-14 NOTE — Therapy (Signed)
 OUTPATIENT PHYSICAL THERAPY SHOULDER EVALUATION   Patient Name: David Hooper MRN: [id number] DOB:July 20, 2000, 23 y.o., male Today's Date: 05/14/2024  END OF SESSION:  PT End of Session - 05/14/24 1053     Visit Number 1    Number of Visits 12    Date for Recertification  07/12/24    PT Start Time 0944    PT Stop Time 1038    PT Time Calculation (min) 54 min    Activity Tolerance Patient tolerated treatment well    Behavior During Therapy [facility] for tasks assessed/performed          Past Medical History:  Diagnosis Date   Psoriasis    Past Surgical History:  Procedure Laterality Date   CIRCUMCISION     Patient Active Problem List   Diagnosis Date Noted   Chronic pain of both shoulders 04/23/2024   Preventative health care 10/26/2022   Psoriasis 10/26/2022   Obesity (BMI 30-39.9) 10/26/2022   Bilateral impacted cerumen 10/26/2022   Headache 09/05/2013   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 Pt reporting pain for > 6 months. Pt reporting he has been going to chiropractor for his neck and low back pain.   PERTINENT HISTORY: Prer MD note:  Exam suspicious for rotator cuff tendinopathy L>R. Not consistent with shoulder bursitis or glenohumeral tear.  Rec naprosyn  course, ice/heat prn, provided with exercises from [facility] pt advisor on RTC injury.  Recommend limit weight lifting until after physical  therapy.  PAIN:  NPRS scale: no pain at rest, 2/10 with reaching and worse pain 7/10 Pain location: bilateral shoulder anterior Pain description: achy, sharp at times Aggravating factors: lifting sholders above head and with weights Relieving factors: pain meds, ibprofen  PRECAUTIONS: None  WEIGHT BEARING RESTRICTIONS: no  FALLS:  Has patient fallen in last 6 months? 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 0 1 2 3 4 5 6 7 8 9  10 (Date and Score)   Activity Eval  05/14/24    1. Weight lifting 25# from floor to shoulder height 7     2. Putting on vest for work 8     3.     4.    5.    Score 7.5    Total score = sum of the activity scores/number of activities Minimum detectable change (90%CI) for average score = 2 points Minimum detectable change (90%CI) for single activity score = 3 points  COGNITION: Overall cognitive status: WFL     SENSATION: WFL  POSTURE: Rounded shoulders, forward head  UPPER EXTREMITY ROM:   ROM Right Eval 05/14/24 Left Eval 05/14/24  Shoulder flexion 170 c mild pain 170 c mild pain  Shoulder extension 65 45  Shoulder abduction 145 pain began at 125 148 pain began at 130  Shoulder adduction    Shoulder internal rotation    Shoulder external rotation 90 60 c moderate pain  Elbow flexion    Elbow extension    Wrist flexion    Wrist extension    Wrist ulnar deviation    Wrist radial deviation    Wrist pronation    Wrist supination     (Blank rows = not tested)  UPPER EXTREMITY MMT:  MMT Right Eval 05/14/24 HHD in pounds Left Eval 05/14/24 HHD in pounds  Shoulder flexion 17.8 17.0  14.9 15.3  Shoulder extension    Shoulder abduction 11.8 11.6 12.7 11.5  Shoulder adduction    Shoulder internal rotation    Shoulder external rotation 21.8 19.7 20.7 23.3  Middle trapezius    Lower trapezius    Elbow flexion 54.7 54.4  Elbow extension    Wrist flexion    Wrist extension    Wrist ulnar deviation    Wrist radial deviation    Wrist pronation    Wrist supination    Grip strength (lbs)    (Blank rows = not tested)  SHOULDER SPECIAL TESTS: Rotator cuff assessment: Empty can test: positive bilateral Biceps assessment: negative bilaterally   PALPATION:  05/14/24 TTP; middle deltoid bilaterally, anterior shoulder bilaterally, bilateral upper trap tightness  TODAY'S TREATMENT:                                                                                                       DATE: 05/14/24 Therex: HEP instruction/performance c cues for techniques, handout provided.  Trial set performed of each for comprehension and symptom assessment.  See below for exercise list Self Care:  TPDN discussed with pt and handout issued Recommending to continue gym workouts working on cardio and LE Posture correction when driving and sitting   PATIENT EDUCATION: Education details: HEP, POC Person educated: Patient Education method: Programmer, Multimedia, Demonstration, Verbal cues, and Handouts Education comprehension: verbalized understanding, returned demonstration, and verbal cues required  HOME EXERCISE PROGRAM: Access Code: [email or web address] URL: [email or web address] Date: 05/14/2024 Prepared by: Delon Lunger  Exercises - Standing Shoulder Row with Anchored Resistance  - 2 x daily - 7 x weekly - 2 sets - 15 reps - 5 seconds hold - Doorway Pec Stretch at 90 Degrees Abduction  - 2 x daily - 7 x weekly - 3 sets - 10 reps - Doorway Pec Stretch at 120 Degrees Abduction  - 2 x daily - 7 x weekly - 3 sets - 10 reps - Supine Shoulder Flexion Extension AAROM with Dowel  - 2 x daily - 7 x weekly - 10 reps - Supine Shoulder External Rotation in 45 Degrees Abduction AAROM with Dowel  - 2 x daily - 7 x weekly - 10 reps - Standing Isometric Shoulder Abduction with Doorway - Arm Bent  - 2 x daily - 7 x weekly - 10 reps - 5 seconds hold  ASSESSMENT:  CLINICAL IMPRESSION: Patient is a 23 y.o. who comes to clinic with complaints of bil soulder pain with mobility, strength and movement coordination deficits that impair their ability to perform usual daily and recreational functional activities without increase difficulty/symptoms at this time.  Patient to benefit from skilled PT services to address impairments and limitations to improve to previous level of function without restriction secondary to condition.   OBJECTIVE IMPAIRMENTS: decreased mobility, decreased ROM, decreased strength, postural dysfunction, and pain.   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   REHAB POTENTIAL: Excellent  CLINICAL DECISION MAKING: Stable/uncomplicated  EVALUATION COMPLEXITY: Low   GOALS: Goals reviewed with patient? Yes  SHORT TERM GOALS: (target date for Short term goals are 3 weeks 06/04/2024)  1.Patient will demonstrate independent use of home exercise program to maintain progress from in clinic treatments. Goal status: New  LONG TERM GOALS: (target dates for all long term goals are 8 weeks  07/12/24 )   1. Patient will  demonstrate/report pain at worst less than or equal to 2/10 to facilitate minimal limitation in daily activity secondary to pain symptoms. Goal status: New   2. Patient will demonstrate independent use of home exercise program to facilitate ability to maintain/progress functional gains from skilled physical therapy services. Goal status: New   3. Patient will demonstrate Patient  specific functional scale avg > or = 9.5 to indicate reduced disability due to condition.  Goal status: New   4.  Patient will demonstrate bil UE MMT using HHD by 5 pounds  throughout to facilitate lifting, reaching, carrying at PLOF in daily activity.   Goal status: New   5.  Patient will demonstrate bil shoulder ER to >/= 80 degrees with no pain reported.    Goal status: New   6.  Pt will be able to lift 25# from floor to over head with bil UE's with no pain for returning to lifting his child and work related activities.  Goal status: New     PLAN:  PT FREQUENCY: 1-2x/week for 12 visits if needed  PT DURATION: 8 weeks  PLANNED INTERVENTIONS: Can include 02853- PT Re-evaluation, 97110-Therapeutic exercises, 97530- Therapeutic activity, [id number]- Neuromuscular re-education, 97535- Self Care, 97140- Manual therapy, [phone]- Gait training, [id number]- Orthotic Fit/training, [phone]- Canalith repositioning, [id number]- Aquatic Therapy, [phone]- Electrical stimulation (unattended), [id number] Physical performance testing, 97016- Vasopneumatic device, [id number]- Ultrasound, [id number]- Traction (mechanical), [id number]- Ionotophoresis 4mg /ml Dexamethasone,  20560 - Needle insertion w/o injection 1 or 2 muscles, 20561 - Needle insertion w/o injection 3 or more muscles.   Patient/Family education, Balance training, Stair training, Taping, Dry Needling, Joint mobilization, Joint manipulation, Spinal manipulation, Spinal mobilization, Scar mobilization, Vestibular training, Visual/preceptual remediation/compensation, DME instructions, Cryotherapy, and Moist  heat.  All performed as medically necessary.  All included unless contraindicated  PLAN FOR NEXT SESSION: Review HEP knowledge/results. Shoulder strengthening, manual as needed,  considering DN for deltoid and upper traps Measure cervical ROM    Delon JONELLE Lunger, PT, MPT 05/14/2024, 10:55 AM

## 2024-05-14 NOTE — Patient Instructions (Signed)

## 2024-05-29 ENCOUNTER — Ambulatory Visit: Admitting: Rehabilitative and Restorative Service Providers"

## 2024-05-29 ENCOUNTER — Encounter: Payer: Self-pay | Admitting: Rehabilitative and Restorative Service Providers"

## 2024-05-29 DIAGNOSIS — R293 Abnormal posture: Secondary | ICD-10-CM

## 2024-05-29 DIAGNOSIS — M25512 Pain in left shoulder: Secondary | ICD-10-CM | POA: Diagnosis not present

## 2024-05-29 DIAGNOSIS — M6281 Muscle weakness (generalized): Secondary | ICD-10-CM

## 2024-05-29 DIAGNOSIS — G8929 Other chronic pain: Secondary | ICD-10-CM

## 2024-05-29 DIAGNOSIS — M25511 Pain in right shoulder: Secondary | ICD-10-CM

## 2024-05-29 NOTE — Therapy (Signed)
 OUTPATIENT PHYSICAL THERAPY TREATMENT   Patient Name: David Hooper MRN: [id number] DOB:03-27-2001, 23 y.o., male Today's Date: 05/29/2024  END OF SESSION:  PT End of Session - 05/29/24 1051     Visit Number 2    Number of Visits 12    Date for Recertification  07/12/24    PT Start Time 1054    PT Stop Time 1134    PT Time Calculation (min) 40 min    Activity Tolerance Patient tolerated treatment well    Behavior During Therapy 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    PCP: David Lynwood HERO, NP   REFERRING PROVIDER: Rilla Baller, MD   REFERRING DIAG:  Diagnosis  M25.511,G89.29,M25.512 (ICD-10-CM) - Chronic pain of both shoulders    THERAPY DIAG:  Chronic pain of both shoulders  Muscle weakness (generalized)  Abnormal posture  Rationale for Evaluation and Treatment: Rehabilitation  ONSET DATE: + 6 months  SUBJECTIVE:                                                                                                                                                                                      SUBJECTIVE STATEMENT: Pt indicated having complaints this morning in back of neck.  Pt indicated Rt shoulder was hurting with bench.    PERTINENT HISTORY: Prer MD note:  Exam suspicious for rotator cuff tendinopathy L>R. Not consistent with shoulder bursitis or glenohumeral tear.  Rec naprosyn  course, ice/heat prn, provided with exercises from David Hooper pt advisor on RTC injury.  Recommend limit weight lifting until after physical therapy.  PAIN:   NPRS scale: 3/10 Pain location: Hooper/upper rap Pain description: achy, sharp at times Aggravating factors: lifting sholders above head and with weights Relieving factors: pain meds, ibprofen  PRECAUTIONS: None  WEIGHT BEARING RESTRICTIONS: no  FALLS:  Has patient fallen in last 6 months? 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 10 represents able to perform at prior level. 0 1 2 3 4 5 6 7 8 9  10 (Date and Score)   Activity Eval  05/14/24    1. Weight lifting 25# from floor to shoulder height 7     2. Putting on vest for work 8     3.     4.    5.    Score 7.5    Total score = sum of the activity scores/number of activities Minimum detectable change (90%CI) for average score = 2 points Minimum detectable change (90%CI) for single activity score = 3 points  COGNITION: 05/14/24 Overall cognitive status: WFL     SENSATION: 05/14/24 WFL  POSTURE: 05/14/24 Rounded shoulders, forward head  UPPER EXTREMITY ROM:   ROM Right Eval 05/14/24 Left Eval 05/14/24  Shoulder flexion 170 c mild pain 170 c mild pain  Shoulder extension 65 45  Shoulder abduction 145 pain began at 125 148 pain began at 130  Shoulder adduction    Shoulder internal rotation    Shoulder external rotation 90 60 c moderate pain  Elbow flexion    Elbow extension    Wrist flexion    Wrist extension    Wrist ulnar deviation    Wrist radial deviation    Wrist pronation    Wrist supination    (Blank rows = not tested)  UPPER  EXTREMITY MMT:  MMT Right Eval 05/14/24 HHD in pounds Left Eval 05/14/24 HHD in pounds  Shoulder flexion 17.8 17.0  14.9 15.3  Shoulder extension    Shoulder abduction 11.8 11.6 12.7 11.5  Shoulder adduction    Shoulder internal rotation    Shoulder external rotation 21.8 19.7 20.7 23.3  Middle trapezius    Lower trapezius    Elbow flexion 54.7 54.4  Elbow extension    Wrist flexion    Wrist extension    Wrist ulnar deviation    Wrist radial deviation    Wrist pronation    Wrist supination    Grip strength (lbs)    (Blank rows = not tested)  SHOULDER SPECIAL TESTS: 05/14/24 Rotator cuff assessment: Empty can test: positive bilateral Biceps assessment: negative bilaterally   PALPATION:  05/29/2024: Thoracic cPA hypomobility mild with pain symptoms T5, T6, T7  05/14/24 TTP; middle deltoid bilaterally, anterior shoulder bilaterally, bilateral upper trap tightness  TODAY'S TREATMENT:                                                                                                       DATE:  05/29/2024 Manual cPA T4-T9 G3-G4.  Regional PA manip G5 x 2 to mid thoracic Percussive device to mid and upper thoracic paraspinals, bilateral upper trap.   Neuro Re-Ed UBE UE only fwd/back 3 mins each way for muscle activation, mobility.  Lvl 3.5  Prone Scapular Retraction  10 reps - 5 hold Prone Scapular Slide with Shoulder Extension  10 reps - 5 hold Prone Scapular Retraction Arms at Side 10 reps - 5 hold Prone Scapular Retraction Y  10 reps - 5 hold Prone Scapular Protraction Retraction AROM on Forearms   10 reps - 5 hold Seated cervical retraction isometric activation against wall 5 sec hold x 10  Time spent in education of cues and techniques.    TODAY'S TREATMENT:                                                                                                        DATE: 05/14/24 Therex: HEP instruction/performance c cues for techniques, handout provided.  Trial set performed of each for comprehension and symptom assessment.  See below for exercise list Self Care:  TPDN discussed with pt and handout issued Recommending to continue gym workouts working on cardio and LE Posture correction when driving and sitting   PATIENT EDUCATION: 05/29/2024 Education details: HEP update Person educated: Patient Education method: Programmer, Multimedia, Demonstration, Verbal cues, and Handouts Education comprehension: verbalized understanding, returned demonstration, and verbal cues required  HOME EXERCISE PROGRAM: Access Code: [email or web address] URL: [email or web address] Date: 05/29/2024 Prepared by: Ozell Silvan  Exercises - Standing Shoulder Row with Anchored Resistance  - 2 x daily - 7 x weekly - 2 sets - 15 reps - 5 seconds hold - Doorway Pec Stretch at 90 Degrees Abduction  - 2 x daily - 7 x weekly - 3 sets - 10 reps - Doorway Pec Stretch at 120 Degrees Abduction  - 2 x daily - 7 x weekly - 3 sets - 10 reps - Supine Shoulder Flexion Extension AAROM with Dowel  - 2 x daily - 7 x weekly - 10 reps - Supine Shoulder External Rotation in 45 Degrees Abduction AAROM with Dowel  - 2 x daily - 7 x weekly - 10 reps - Standing Isometric Shoulder Abduction with Doorway - Arm Bent  - 2 x daily - 7 x weekly - 10 reps - 5 seconds hold - Prone Scapular Retraction  - 1 x daily - 7 x weekly - 1 sets - 10 reps -  5 hold - Prone Scapular Slide with Shoulder Extension  - 1 x daily - 7 x weekly - 1 sets - 10 reps - 5 hold - Prone Scapular Retraction Arms at Side  - 1 x daily - 7 x weekly - 1 sets - 10 reps - 5 hold - Prone Scapular Retraction Y  - 1 x daily - 7 x weekly - 1 sets - 10 reps - 5 hold - Prone Scapular Protraction Retraction AROM on Forearms  - 1 x daily - 7 x weekly - 3 sets - 10  reps - 5 hold  ASSESSMENT:  CLINICAL IMPRESSION: Mild thoracic hypomobility noted in mid and upper thoracic with pain located with testing that connected to concordant symptoms in area today. Progressed HEP to include periscapular activation/endurance improvements.   Continued skilled PT services indicated to address shoulder pains and cervicothoracic pain(interconnection of areas noted).   OBJECTIVE IMPAIRMENTS: decreased mobility, decreased ROM, decreased strength, postural dysfunction, and pain.   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   REHAB POTENTIAL: Excellent  CLINICAL DECISION MAKING: Stable/uncomplicated  EVALUATION COMPLEXITY: Low   GOALS: Goals reviewed with patient? Yes  SHORT TERM GOALS: (target date for Short term goals are 3 weeks 06/04/2024)  1.Patient will demonstrate independent use of home exercise program to maintain progress from in clinic treatments. Goal status: on going 05/29/2024  LONG TERM GOALS: (target dates for all long term goals are 8 weeks  07/12/24 )   1. Patient will demonstrate/report pain at worst less than or equal to 2/10 to facilitate minimal limitation in daily activity secondary to pain symptoms. Goal status: New   2. Patient will demonstrate independent use of home exercise program to facilitate ability to maintain/progress functional gains from skilled physical therapy services. Goal status: New   3. Patient will demonstrate Patient specific functional scale avg > or = 9.5 to indicate reduced disability due to condition.  Goal status: New   4.  Patient will demonstrate bil UE MMT using HHD by 5 pounds  throughout to facilitate lifting, reaching, carrying at PLOF in daily activity.   Goal status: New   5.  Patient will demonstrate bil shoulder ER to  >/= 80 degrees with no pain reported.    Goal status: New   6.  Pt will be able to lift 25# from floor to over head with bil UE's with no pain for returning to lifting his child and work related activities.  Goal status: New     PLAN:  PT FREQUENCY: 1-2x/week for 12 visits if needed  PT DURATION: 8 weeks  PLANNED INTERVENTIONS: Can include 02853- PT Re-evaluation, 97110-Therapeutic exercises, 97530- Therapeutic activity, [id number]- Neuromuscular re-education, 97535- Self Care, 97140- Manual therapy, [phone]- Gait training, [phone]- Orthotic Fit/training, [phone]- Canalith repositioning, [id number]- Aquatic Therapy, [phone]- Electrical stimulation (unattended), [id number] Physical performance testing, 97016- Vasopneumatic device, [id number]- Ultrasound, [id number]- Traction (mechanical), [id number]- Ionotophoresis 4mg /ml Dexamethasone,  20560 - Needle insertion w/o injection 1 or 2 muscles, 20561 - Needle insertion w/o injection 3 or more muscles.   Patient/Family education, Balance training, Stair training, Taping, Dry Needling, Joint mobilization, Joint manipulation, Spinal manipulation, Spinal mobilization, Scar mobilization, Vestibular training, Visual/preceptual remediation/compensation, DME instructions, Cryotherapy, and Moist heat.  All performed as medically necessary.  All included unless contraindicated  PLAN FOR NEXT SESSION: Check on muscle activation activity additions in HEP for postural  support, scapular mobility.  Resume GH joint mobility/strengthening progression pending symptom areas reported.    Ozell Silvan, PT, DPT, OCS, ATC 05/29/2024  11:35 AM

## 2024-06-04 ENCOUNTER — Encounter: Admitting: Physical Therapy

## 2024-06-10 ENCOUNTER — Encounter: Admitting: Physical Therapy

## 2024-06-10 ENCOUNTER — Telehealth: Payer: Self-pay | Admitting: Physical Therapy

## 2024-06-10 NOTE — Telephone Encounter (Signed)
 Pt was called to follow up with why he missed his 1:00 pm PT appointment today. I left pt a message reminding on his next appointment on 06/18/23 at 11:00.  Delon Lunger, PT, MPT 06/10/2024 2:24 PM

## 2024-06-17 ENCOUNTER — Encounter: Payer: Self-pay | Admitting: Physical Therapy

## 2024-06-17 ENCOUNTER — Ambulatory Visit: Admitting: Physical Therapy

## 2024-06-17 DIAGNOSIS — G8929 Other chronic pain: Secondary | ICD-10-CM

## 2024-06-17 DIAGNOSIS — M25512 Pain in left shoulder: Secondary | ICD-10-CM

## 2024-06-17 DIAGNOSIS — R293 Abnormal posture: Secondary | ICD-10-CM | POA: Diagnosis not present

## 2024-06-17 DIAGNOSIS — M6281 Muscle weakness (generalized): Secondary | ICD-10-CM

## 2024-06-17 DIAGNOSIS — M25511 Pain in right shoulder: Secondary | ICD-10-CM

## 2024-06-17 NOTE — Therapy (Signed)
 " OUTPATIENT PHYSICAL THERAPY TREATMENT   Patient Name: David Hooper MRN: [id number] DOB:22-Dec-2000, 24 y.o., male Today's Date: 06/17/2024  END OF SESSION:  PT End of Session - 06/17/24 1100     Visit Number 3    Number of Visits 12    Date for Recertification  07/12/24    PT Start Time 1100    PT Stop Time 1140    PT Time Calculation (min) 40 min    Activity Tolerance Patient tolerated treatment well    Behavior During Therapy [facility] for tasks assessed/performed           Past Medical History:  Diagnosis Date   Psoriasis    Past Surgical History:  Procedure Laterality Date   CIRCUMCISION     Patient Active Problem List   Diagnosis Date Noted   Chronic pain of both shoulders 04/23/2024   Preventative health care 10/26/2022   Psoriasis 10/26/2022   Obesity (BMI 30-39.9) 10/26/2022   Bilateral impacted cerumen 10/26/2022   Headache 09/05/2013   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    PCP: Wendee Lynwood HERO, NP   REFERRING PROVIDER: Rilla Baller, MD   REFERRING DIAG:  Diagnosis  M25.511,G89.29,M25.512 (ICD-10-CM) - Chronic pain of both shoulders    THERAPY DIAG:  Chronic pain of both shoulders  Muscle weakness (generalized)  Abnormal posture  Rationale for Evaluation and Treatment: Rehabilitation  ONSET DATE: + 6 months  SUBJECTIVE:                                                                                                                                                                                      SUBJECTIVE STATEMENT: Pt arriving to therapy reporting Rt hand soreness/pain. Left sholder pain 3/10  PERTINENT HISTORY: Prer MD note:  Exam suspicious for rotator cuff tendinopathy L>R. Not consistent with shoulder bursitis or glenohumeral tear.  Rec naprosyn  course, ice/heat prn, provided with exercises from [facility] pt advisor on RTC injury.  Recommend limit weight lifting until after physical therapy.  PAIN:  NPRS scale: 7/10 Rt hand, left  upper trap 3/10  Pain description: achy, sharp at times Aggravating factors: lifting sholders above head and with weights Relieving factors: pain meds, ibprofen  PRECAUTIONS: None  WEIGHT BEARING RESTRICTIONS: no  FALLS:  Has patient fallen in last 6 months? 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 10 represents able to perform at prior level. 0 1 2 3 4 5 6 7 8 9  10 (Date and Score)   Activity Eval  05/14/24 06/17/24   1. Weight lifting 25# from floor to shoulder height 7   7  2. Putting on vest for work 8   7  3.     4.    5.    Score 7.5 7   Total score = sum of the activity scores/number of activities Minimum detectable change (90%CI) for average score = 2 points Minimum detectable change (90%CI) for single activity score = 3 points  COGNITION: 05/14/24 Overall cognitive status: WFL     SENSATION: 05/14/24 WFL  POSTURE: 05/14/24 Rounded shoulders, forward head  UPPER EXTREMITY ROM:   ROM Right Eval 05/14/24 Left Eval 05/14/24  Shoulder flexion 170 c mild pain 170 c mild pain  Shoulder extension 65 45  Shoulder abduction 145 pain began at 125 148 pain began at 130  Shoulder adduction    Shoulder internal rotation    Shoulder external rotation 90 60 c moderate pain  Elbow flexion    Elbow extension    Wrist flexion    Wrist extension    Wrist ulnar deviation    Wrist radial deviation    Wrist pronation    Wrist supination    (Blank rows = not tested)  UPPER EXTREMITY MMT:  MMT  Right Eval 05/14/24 HHD in pounds Left Eval 05/14/24 HHD in pounds  Shoulder flexion 17.8 17.0  14.9 15.3  Shoulder extension    Shoulder abduction 11.8 11.6 12.7 11.5  Shoulder adduction    Shoulder internal rotation    Shoulder external rotation 21.8 19.7 20.7 23.3  Middle trapezius    Lower trapezius    Elbow flexion 54.7 54.4  Elbow extension    Wrist flexion    Wrist extension    Wrist ulnar deviation    Wrist radial deviation    Wrist pronation    Wrist supination    Grip strength (lbs)    (Blank rows = not tested)  SHOULDER SPECIAL TESTS: 05/14/24 Rotator cuff assessment: Empty can test: positive bilateral Biceps assessment: negative bilaterally   PALPATION:  05/29/2024: Thoracic cPA hypomobility mild with pain symptoms T5, T6, T7  05/14/24 TTP; middle deltoid bilaterally, anterior shoulder bilaterally, bilateral upper trap tightness  TODAY'S TREATMENT:                                                                                                       DATE:  06/17/24 Manual Grade 3 GH distraction/inferior glides of pt's Left shoulder Percussive device to mid and upper thoracic paraspinals, bilateral upper trap.   Neuro Re-Ed UBE UE only fwd/back 3 mins each way for muscle activation, mobility.  Lvl 3.5  Prone W's for ER x 10 - hold 5 sec  Prone Scapular Slide with Shoulder Extension  x 10 reps - 5 hold Prone Scapular Retraction Arms at Side  x 10 reps - 5 hold Prone Scapular Retraction Y  x 10 reps - 5 hold Supine cervical retraction 2 x 10 - 5 hold Supine serratus punches  x 10, and x 10 c 2# weight    TODAY'S TREATMENT:                                                                                                       DATE:  05/29/2024 Manual cPA T4-T9  G3-G4.  Regional PA manip G5 x 2 to mid thoracic Percussive device to mid and upper thoracic paraspinals, bilateral upper trap.   Neuro Re-Ed UBE UE only fwd/back 3 mins each way for muscle activation, mobility.  Lvl 3.5  Prone Scapular Retraction  10 reps - 5 hold Prone Scapular Slide with Shoulder Extension  10 reps - 5 hold Prone Scapular Retraction Arms at Side 10 reps - 5 hold Prone Scapular Retraction Y  10 reps - 5 hold Prone Scapular Protraction Retraction AROM on Forearms   10 reps - 5 hold Seated cervical retraction isometric activation against wall 5 sec hold x 10  Time spent in education of cues and techniques.    TODAY'S TREATMENT:                                                                                                       DATE: 05/14/24 Therex: HEP instruction/performance c cues for techniques, handout provided.  Trial set performed of each for comprehension and symptom assessment.  See below for exercise list Self Care:  TPDN discussed with pt and handout issued Recommending to continue gym workouts  working on cardio and LE Posture correction when driving and sitting   PATIENT EDUCATION: 05/29/2024 Education details: HEP update Person educated: Patient Education method: Explanation, Demonstration, Verbal cues, and Handouts Education comprehension: verbalized understanding, returned demonstration, and verbal cues required  HOME EXERCISE PROGRAM: Access Code: [email or web address] URL: [email or web address] Date: 06/17/2024 Prepared by: Delon Lunger  Exercises - Standing Shoulder Row with Anchored Resistance  - 2 x daily - 7 x weekly - 2 sets - 15 reps - 5 seconds hold - Doorway Pec Stretch at 90 Degrees Abduction  - 2 x daily - 7 x weekly - 3 sets - 10 reps - Doorway Pec Stretch at 120 Degrees Abduction  - 2 x daily - 7 x weekly - 3 sets - 10 reps - Supine Shoulder Flexion Extension AAROM with Dowel  - 2 x daily - 7 x weekly - 10 reps - Supine  Shoulder External Rotation in 45 Degrees Abduction AAROM with Dowel  - 2 x daily - 7 x weekly - 10 reps - Standing Isometric Shoulder Abduction with Doorway - Arm Bent  - 2 x daily - 7 x weekly - 10 reps - 5 seconds hold - Prone Scapular Retraction  - 1 x daily - 7 x weekly - 1 sets - 10 reps - 5 hold - Prone Scapular Slide with Shoulder Extension  - 1 x daily - 7 x weekly - 1 sets - 10 reps - 5 hold - Prone Scapular Retraction Arms at Side  - 1 x daily - 7 x weekly - 1 sets - 10 reps - 5 hold - Prone Scapular Retraction Y  - 1 x daily - 7 x weekly - 1 sets - 10 reps - 5 hold - Prone Scapular Protraction Retraction AROM on Forearms  - 1 x daily - 7 x weekly - 3 sets - 10 reps - 5 hold - Standing Median Nerve Glide  - 2 x daily - 7 x weekly - 10 reps - 3 seconds hold - Supine Scapular Protraction in Flexion with Dumbbells  - 1 x daily - 7 x weekly - 3 sets - 10 reps  ASSESSMENT:  CLINICAL IMPRESSION: Pt arriving today with pain in his palmar surface of his Rt hand. Median nerve flossing was added to pt's HEP with good response in session today. Pt also issued green Thera putty for home use. Pt with good response to shoulder protraction exercises with less pain reported. Pt's HEP was reviewed with compliance noted. Recommend continued skilled Interventions.  OBJECTIVE IMPAIRMENTS: decreased mobility, decreased ROM, decreased strength, postural dysfunction, and pain.   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   REHAB POTENTIAL: Excellent  CLINICAL DECISION MAKING: Stable/uncomplicated  EVALUATION COMPLEXITY: Low   GOALS: Goals reviewed with patient? Yes  SHORT TERM GOALS: (target date for Short term goals are 3 weeks 06/04/2024)  1.Patient will demonstrate independent use of home exercise program to  maintain progress from in clinic treatments. Goal status: on going 05/29/2024  LONG TERM GOALS: (target dates for all long term goals are 8 weeks  07/12/24 )   1. Patient will demonstrate/report pain at worst less than or equal to 2/10 to facilitate minimal limitation in daily activity secondary to pain symptoms. Goal status: New   2. Patient will demonstrate independent use of home exercise program to facilitate ability to maintain/progress functional gains from  skilled physical therapy services. Goal status: New   3. Patient will demonstrate Patient specific functional scale avg > or = 9.5 to indicate reduced disability due to condition.  Goal status: New   4.  Patient will demonstrate bil UE MMT using HHD by 5 pounds  throughout to facilitate lifting, reaching, carrying at PLOF in daily activity.   Goal status: New   5.  Patient will demonstrate bil shoulder ER to >/= 80 degrees with no pain reported.    Goal status: New   6.  Pt will be able to lift 25# from floor to over head with bil UE's with no pain for returning to lifting his child and work related activities.  Goal status: New     PLAN:  PT FREQUENCY: 1-2x/week for 12 visits if needed  PT DURATION: 8 weeks  PLANNED INTERVENTIONS: Can include 02853- PT Re-evaluation, 97110-Therapeutic exercises, 97530- Therapeutic activity, [id number]- Neuromuscular re-education, 97535- Self Care, 97140- Manual therapy, [phone]- Gait training, [phone]- Orthotic Fit/training, [phone]- Canalith repositioning, [id number]- Aquatic Therapy, [phone]- Electrical stimulation (unattended), [id number] Physical performance testing, 97016- Vasopneumatic device, [id number]- Ultrasound, [id number]- Traction (mechanical), [id number]- Ionotophoresis 4mg /ml Dexamethasone,  20560 - Needle insertion w/o injection 1 or 2 muscles, 20561 - Needle insertion w/o injection 3 or more muscles.   Patient/Family education, Balance training, Stair training, Taping, Dry Needling, Joint mobilization, Joint  manipulation, Spinal manipulation, Spinal mobilization, Scar mobilization, Vestibular training, Visual/preceptual remediation/compensation, DME instructions, Cryotherapy, and Moist heat.  All performed as medically necessary.  All included unless contraindicated  PLAN FOR NEXT SESSION: Resume GH joint mobility/strengthening progression, how did nerve flossing and shoulder protraction exercises go?    Delon Lunger, PT, MPT 06/17/2024 12:02 PM   06/17/2024  12:02 PM   "

## 2024-06-25 ENCOUNTER — Telehealth: Payer: Self-pay | Admitting: Physical Therapy

## 2024-06-25 ENCOUNTER — Encounter: Admitting: Physical Therapy

## 2024-06-25 NOTE — Telephone Encounter (Signed)
 I called to follow up with pt after he missed his 11:00 PT appointment. I left a message reminding pt of his next upcoming PT visit on 07/02/24 at 11:00 am. I left our clinic number of [phone] if pt wished to reschedule his missed visit.  Delon Lunger, PT, MPT 06/25/2024 11:37 AM

## 2024-07-02 ENCOUNTER — Ambulatory Visit: Admitting: Physical Therapy

## 2024-07-02 ENCOUNTER — Encounter: Payer: Self-pay | Admitting: Physical Therapy

## 2024-07-02 DIAGNOSIS — R293 Abnormal posture: Secondary | ICD-10-CM

## 2024-07-02 DIAGNOSIS — G8929 Other chronic pain: Secondary | ICD-10-CM

## 2024-07-02 DIAGNOSIS — M25512 Pain in left shoulder: Secondary | ICD-10-CM

## 2024-07-02 DIAGNOSIS — M6281 Muscle weakness (generalized): Secondary | ICD-10-CM

## 2024-07-02 DIAGNOSIS — M25511 Pain in right shoulder: Secondary | ICD-10-CM | POA: Diagnosis not present

## 2024-07-02 NOTE — Therapy (Signed)
 " OUTPATIENT PHYSICAL THERAPY TREATMENT   Patient Name: David Hooper MRN: [id number] DOB:2001-01-16, 24 y.o., male Today's Date: 07/02/2024  END OF SESSION:  PT End of Session - 07/02/24 1248     Visit Number 4    Number of Visits 12    Date for Recertification  07/12/24    PT Start Time 1119    PT Stop Time 1207    PT Time Calculation (min) 48 min    Activity Tolerance Patient tolerated treatment well    Behavior During Therapy [facility] for tasks assessed/performed            Past Medical History:  Diagnosis Date   Psoriasis    Past Surgical History:  Procedure Laterality Date   CIRCUMCISION     Patient Active Problem List   Diagnosis Date Noted   Chronic pain of both shoulders 04/23/2024   Preventative health care 10/26/2022   Psoriasis 10/26/2022   Obesity (BMI 30-39.9) 10/26/2022   Bilateral impacted cerumen 10/26/2022   Headache 09/05/2013   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    PCP: Wendee Lynwood HERO, NP   REFERRING PROVIDER: Rilla Baller, MD   REFERRING DIAG:  Diagnosis  M25.511,G89.29,M25.512 (ICD-10-CM) - Chronic pain of both shoulders    THERAPY DIAG:  Chronic pain of both shoulders  Muscle weakness (generalized)  Abnormal posture  Rationale for Evaluation and Treatment: Rehabilitation  ONSET DATE: + 6 months  SUBJECTIVE:                                                                                                                                                                                      SUBJECTIVE STATEMENT: Left shoulder pain 2-3/10 at rest, 7/10 with exercises. Pt shoulder 4/10 c exercises, Pt also reporting some neck pain.   PERTINENT HISTORY: Prer MD note:  Exam suspicious for rotator cuff tendinopathy L>R. Not consistent with shoulder bursitis or glenohumeral tear.  Rec naprosyn  course, ice/heat prn, provided with exercises from [facility] pt advisor on RTC injury.  Recommend limit weight lifting until after physical  therapy.  PAIN:  NPRS scale: 2-3/10 at rest, on left, 7/10 c exercises, Rt shoulder 4/10 with exercises  Pain description: achy, sharp at times Aggravating factors: lifting sholders above head and with weights Relieving factors: pain meds, ibprofen  PRECAUTIONS: None  WEIGHT BEARING RESTRICTIONS: no  FALLS:  Has patient fallen in last 6 months? 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 10 represents able to perform at prior level. 0 1 2 3 4 5 6 7 8 9  10 (Date and Score)   Activity Eval  05/14/24 06/17/24   1. Weight lifting 25# from floor to shoulder height 7   7  2. Putting on vest for work 8   7  3.     4.    5.    Score 7.5 7   Total score = sum of the activity scores/number of activities Minimum detectable change (90%CI) for average score = 2 points Minimum detectable change (90%CI) for single activity score = 3 points  COGNITION: 05/14/24 Overall cognitive status: WFL     SENSATION: 05/14/24 WFL  POSTURE: 05/14/24 Rounded shoulders, forward head  UPPER EXTREMITY ROM:   ROM Right Eval 05/14/24 Left Eval 05/14/24 07/02/24 Rt / Left  Shoulder flexion 170 c mild pain 170 c mild pain 170 / 172 More stiffness noted on Rt  Shoulder extension 65 45   Shoulder abduction 145 pain began at 125 148 pain began at 130 162/160,  pain noted at 65 deg   Shoulder adduction     Shoulder internal rotation     Shoulder external rotation 90 60 c moderate pain 90 / 90   Elbow flexion     Elbow extension     Wrist  flexion     Wrist extension     Wrist ulnar deviation     Wrist radial deviation     Wrist pronation     Wrist supination     (Blank rows = not tested)  UPPER EXTREMITY MMT:  MMT Right Eval 05/14/24 HHD in pounds Left Eval 05/14/24 HHD in pounds Rt 07/02/24 HHD in pounds sitting Left 07/02/24 HHD in pounds sitting  Shoulder flexion 17.8 17.0  14.9 15.3 40.2 40.3 35.2 34.1  Shoulder extension      Shoulder abduction 11.8 11.6 12.7 11.5 38.9 40.9 36.4 38.4  Shoulder adduction      Shoulder internal rotation      Shoulder external rotation 21.8 19.7 20.7 23.3 24.8 25.2 30.9 30.2  Middle trapezius      Lower trapezius      Elbow flexion 54.7 54.4    Elbow extension      Wrist flexion      Wrist extension      Wrist ulnar deviation      Wrist radial deviation      Wrist pronation      Wrist supination      Grip strength (lbs)      (Blank rows = not tested)  SHOULDER SPECIAL TESTS: 05/14/24 Rotator cuff assessment: Empty can test: positive bilateral Biceps assessment: negative bilaterally   PALPATION:  05/29/2024: Thoracic cPA hypomobility mild with pain symptoms T5, T6, T7  05/14/24 TTP; middle deltoid bilaterally, anterior shoulder bilaterally, bilateral upper trap tightness  TODAY'S TREATMENT:                                                                                                       DATE:  07/02/24 Manual Percussive device to mid and upper thoracic paraspinals, bilateral upper trap.   Neuro Re-Ed UBE UE only fwd/back 3 mins each way for muscle activation, mobility.  Lvl 3.5  Prone W's for ER x 10 - hold 3 sec  Prone Scapular Slide with Shoulder Extension  x 10 reps - 3 hold Prone Scapular Retraction Arms at Side  x 10 reps - 5 hold Prone Scapular  Retraction Y  x 10 reps - 5 hold Attempted wall angles: x 3 reps, pt reporting increased anterior shoulder pain c approximately 50 deg of shoulder abduction    TODAY'S TREATMENT:                                                                                                       DATE:  06/17/24 Manual Grade 3 GH distraction/inferior glides of pt's Left shoulder Percussive device to mid and upper thoracic paraspinals, bilateral upper trap.   Neuro Re-Ed UBE UE only fwd/back 3 mins each way for muscle activation, mobility.  Lvl 3.5  Prone W's for ER x 10 - hold 5 sec  Prone Scapular Slide with Shoulder Extension  x 10 reps - 5 hold Prone Scapular Retraction Arms at Side  x 10 reps - 5 hold Prone Scapular Retraction Y  x 10 reps - 5 hold Supine cervical retraction 2 x 10 - 5 hold Supine serratus punches  x 10, and x 10 c 2# weight    TODAY'S TREATMENT:                                                                                                       DATE:  05/29/2024 Manual cPA T4-T9 G3-G4.  Regional PA manip G5 x 2 to mid thoracic Percussive device to mid and upper thoracic paraspinals, bilateral upper trap.   Neuro Re-Ed UBE UE only fwd/back 3 mins each way for muscle activation, mobility.  Lvl 3.5  Prone Scapular Retraction  10 reps - 5 hold Prone Scapular Slide with Shoulder Extension  10 reps -  5 hold Prone Scapular Retraction Arms at Side 10 reps - 5 hold Prone Scapular Retraction Y  10 reps - 5 hold Prone Scapular Protraction Retraction AROM on Forearms   10 reps - 5 hold Seated cervical retraction isometric activation against wall 5 sec hold x 10  Time spent in education of cues and techniques.    TODAY'S TREATMENT:                                                                                                       DATE: 05/14/24 Therex: HEP instruction/performance c cues for techniques, handout provided.  Trial set performed of each for comprehension and symptom  assessment.  See below for exercise list Self Care:  TPDN discussed with pt and handout issued Recommending to continue gym workouts working on cardio and LE Posture correction when driving and sitting   PATIENT EDUCATION: 05/29/2024 Education details: HEP update Person educated: Patient Education method: Programmer, Multimedia, Demonstration, Verbal cues, and Handouts Education comprehension: verbalized understanding, returned demonstration, and verbal cues required  HOME EXERCISE PROGRAM: Access Code: [email or web address] URL: [email or web address] Date: 06/17/2024 Prepared by: Delon Lunger  Exercises - Standing Shoulder Row with Anchored Resistance  - 2 x daily - 7 x weekly - 2 sets - 15 reps - 5 seconds hold - Doorway Pec Stretch at 90 Degrees Abduction  - 2 x daily - 7 x weekly - 3 sets - 10 reps - Doorway Pec Stretch at 120 Degrees Abduction  - 2 x daily - 7 x weekly - 3 sets - 10 reps - Supine Shoulder Flexion Extension AAROM with Dowel  - 2 x daily - 7 x weekly - 10 reps - Supine Shoulder External Rotation in 45 Degrees Abduction AAROM with Dowel  - 2 x daily - 7 x weekly - 10 reps - Standing Isometric Shoulder Abduction with Doorway - Arm Bent  - 2 x daily - 7 x weekly - 10 reps - 5 seconds hold - Prone Scapular Retraction  - 1 x daily - 7 x weekly - 1 sets - 10 reps - 5 hold - Prone Scapular Slide with Shoulder Extension  - 1 x daily - 7 x weekly - 1 sets - 10 reps - 5 hold - Prone Scapular Retraction Arms at Side  - 1 x daily - 7 x weekly - 1 sets - 10 reps - 5 hold - Prone Scapular Retraction Y  - 1 x daily - 7 x weekly - 1 sets - 10 reps - 5 hold - Prone Scapular Protraction Retraction AROM on Forearms  - 1 x daily - 7 x weekly - 3 sets - 10 reps - 5 hold - Standing Median Nerve Glide  - 2 x daily - 7 x weekly - 10 reps - 3 seconds hold - Supine Scapular Protraction in Flexion with Dumbbells  - 1 x daily - 7 x weekly - 3 sets - 10 reps  ASSESSMENT:  CLINICAL  IMPRESSION: Pt reporting the nerve flossing has helped some since his last visit. Pt still reporting pain in anterior superior shoulder  with abd and flexion of his left shoulder. Pt's HEP was updated and additonal strengthening exercise added along with sleeper stretch. Pt has made progress with his ROM and strength since his initial evaluation. Recommending continued skilled PT.   OBJECTIVE IMPAIRMENTS: decreased mobility, decreased ROM, decreased strength, postural dysfunction, and pain.   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   REHAB POTENTIAL: Excellent  CLINICAL DECISION MAKING: Stable/uncomplicated  EVALUATION COMPLEXITY: Low   GOALS: Goals reviewed with patient? Yes  SHORT TERM GOALS: (target date for Short term goals are 3 weeks 06/04/2024)  1.Patient will demonstrate independent use of home exercise program to maintain progress from in clinic treatments. Goal status: on going 05/29/2024  LONG TERM GOALS: (target dates for all long term goals are 8 weeks  07/12/24 )   1. Patient will demonstrate/report pain at worst less than or equal to 2/10 to facilitate minimal limitation in daily activity secondary to pain symptoms. Goal status: New   2. Patient will demonstrate independent use of home exercise program to facilitate ability to maintain/progress functional gains from skilled physical therapy services. Goal status: New   3. Patient will demonstrate Patient specific functional scale avg > or = 9.5 to indicate reduced disability due to condition.  Goal status: New   4.  Patient will demonstrate bil UE MMT using HHD by 5 pounds  throughout to facilitate lifting, reaching, carrying at PLOF in daily activity.   Goal status: New   5.  Patient will demonstrate bil shoulder ER to >/= 80 degrees  with no pain reported.    Goal status: New   6.  Pt will be able to lift 25# from floor to over head with bil UE's with no pain for returning to lifting his child and work related activities.  Goal status: New     PLAN:  PT FREQUENCY: 1-2x/week for 12 visits if needed  PT DURATION: 8 weeks  PLANNED INTERVENTIONS: Can include 02853- PT Re-evaluation, 97110-Therapeutic exercises, 97530- Therapeutic activity, [id number]- Neuromuscular re-education, 97535- Self Care, 97140- Manual therapy, [phone]- Gait training, [phone]- Orthotic Fit/training, [id number]- Canalith repositioning, [id number]- Aquatic Therapy, [phone]- Electrical stimulation (unattended), [id number] Physical performance testing, 97016- Vasopneumatic device, [id number]- Ultrasound, [id number]- Traction (mechanical), [id number]- Ionotophoresis 4mg /ml Dexamethasone,  20560 - Needle insertion w/o injection 1 or 2 muscles, 20561 - Needle insertion w/o injection 3 or more muscles.   Patient/Family education, Balance training, Stair training, Taping, Dry Needling, Joint mobilization, Joint manipulation, Spinal manipulation, Spinal mobilization, Scar mobilization, Vestibular training, Visual/preceptual remediation/compensation, DME instructions, Cryotherapy, and Moist heat.  All performed as medically necessary.  All included unless contraindicated  PLAN FOR NEXT SESSION: GH joint mobility/strengthening progression,    Delon Lunger, PT, MPT 07/02/24 1:01 PM   07/02/24  1:01 PM   "

## 2024-07-09 ENCOUNTER — Ambulatory Visit

## 2024-07-09 DIAGNOSIS — M25511 Pain in right shoulder: Secondary | ICD-10-CM

## 2024-07-09 DIAGNOSIS — M25512 Pain in left shoulder: Secondary | ICD-10-CM | POA: Diagnosis not present

## 2024-07-09 DIAGNOSIS — G8929 Other chronic pain: Secondary | ICD-10-CM

## 2024-07-09 DIAGNOSIS — M6281 Muscle weakness (generalized): Secondary | ICD-10-CM

## 2024-07-09 DIAGNOSIS — R293 Abnormal posture: Secondary | ICD-10-CM | POA: Diagnosis not present

## 2024-07-09 NOTE — Therapy (Signed)
 " OUTPATIENT PHYSICAL THERAPY TREATMENT   Patient Name: David Hooper MRN: [id number] DOB:2000/12/10, 24 y.o., male Today's Date: 07/09/2024  END OF SESSION:  PT End of Session - 07/09/24 1225     Visit Number 5    Number of Visits 12    Date for Recertification  07/12/24    PT Start Time 1148    PT Stop Time 1220    PT Time Calculation (min) 32 min    Activity Tolerance Patient tolerated treatment well    Behavior During Therapy [facility] for tasks assessed/performed             Past Medical History:  Diagnosis Date   Psoriasis    Past Surgical History:  Procedure Laterality Date   CIRCUMCISION     Patient Active Problem List   Diagnosis Date Noted   Chronic pain of both shoulders 04/23/2024   Preventative health care 10/26/2022   Psoriasis 10/26/2022   Obesity (BMI 30-39.9) 10/26/2022   Bilateral impacted cerumen 10/26/2022   Headache 09/05/2013   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    PCP: Wendee Lynwood HERO, NP   REFERRING PROVIDER: Rilla Baller, MD   REFERRING DIAG:  Diagnosis  M25.511,G89.29,M25.512 (ICD-10-CM) - Chronic pain of both shoulders    THERAPY DIAG:  Chronic pain of both shoulders  Muscle weakness (generalized)  Abnormal posture  Rationale for Evaluation and Treatment: Rehabilitation  ONSET DATE: + 6 months  SUBJECTIVE:                                                                                                                                                                                      SUBJECTIVE STATEMENT: Left shoulder hurt after sleeping lat night but not with activity. PERTINENT HISTORY: Prer MD note:  Exam suspicious for rotator cuff tendinopathy L>R. Not consistent with shoulder bursitis or glenohumeral tear.  Rec naprosyn  course, ice/heat prn, provided with exercises from [facility] pt advisor on RTC injury.  Recommend limit weight lifting until after physical therapy.  PAIN:  NPRS scale: 2/10 at rest, on left 5/10 Rt  shoulder 4/10 with exercises  Pain description: achy, sharp at times Aggravating factors: lifting sholders above head and with weights Relieving factors: pain meds, ibprofen  PRECAUTIONS: None  WEIGHT BEARING RESTRICTIONS: no  FALLS:  Has patient fallen in last 6 months? 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 10 represents able to perform at prior level. 0 1 2 3 4 5 6 7 8 9  10 (Date and Score)   Activity Eval  05/14/24 06/17/24   1. Weight lifting 25# from floor to shoulder height 7   7  2. Putting on vest for work 8   7  3.     4.    5.    Score 7.5 7   Total score = sum of the activity scores/number of activities Minimum detectable change (90%CI) for average score = 2 points Minimum detectable change (90%CI) for single activity score = 3 points  COGNITION: 05/14/24 Overall cognitive status: WFL     SENSATION: 05/14/24 WFL  POSTURE: 05/14/24 Rounded shoulders, forward head  UPPER EXTREMITY ROM:   ROM Right Eval 05/14/24 Left Eval 05/14/24 07/02/24 Rt / Left  Shoulder flexion 170 c mild pain 170 c mild pain 170 / 172 More stiffness noted on Rt  Shoulder extension 65 45   Shoulder abduction 145 pain began at 125 148 pain began at 130 162/160,  pain noted at 65 deg   Shoulder adduction     Shoulder internal rotation     Shoulder external rotation 90 60 c moderate pain 90 / 90   Elbow flexion     Elbow extension     Wrist flexion     Wrist extension     Wrist ulnar deviation     Wrist radial  deviation     Wrist pronation     Wrist supination     (Blank rows = not tested)  UPPER EXTREMITY MMT:  MMT Right Eval 05/14/24 HHD in pounds Left Eval 05/14/24 HHD in pounds Rt 07/02/24 HHD in pounds sitting Left 07/02/24 HHD in pounds sitting  Shoulder flexion 17.8 17.0  14.9 15.3 40.2 40.3 35.2 34.1  Shoulder extension      Shoulder abduction 11.8 11.6 12.7 11.5 38.9 40.9 36.4 38.4  Shoulder adduction      Shoulder internal rotation      Shoulder external rotation 21.8 19.7 20.7 23.3 24.8 25.2 30.9 30.2  Middle trapezius      Lower trapezius      Elbow flexion 54.7 54.4    Elbow extension      Wrist flexion      Wrist extension      Wrist ulnar deviation      Wrist radial deviation      Wrist pronation      Wrist supination      Grip strength (lbs)      (Blank rows = not tested)  SHOULDER SPECIAL TESTS: 05/14/24 Rotator cuff assessment: Empty can test: positive bilateral Biceps assessment: negative bilaterally   PALPATION:  05/29/2024: Thoracic cPA hypomobility mild with pain symptoms T5, T6, T7  05/14/24 TTP; middle deltoid bilaterally, anterior shoulder bilaterally, bilateral upper trap tightness  TODAY'S TREATMENT:                                                                                                       07/09/24 Neuro Re-Ed UBE UE only fwd/back 3 mins each way for muscle activation, mobility.  No resistance Prone Ts and Vs 2x10 3 sec hold  At wall W's for ER 2x 10 - hold 3 sec  Prone Scapular Slide with Shoulder Extension  x 10 reps - 3 hold Prone Scapular Retraction Arms at Side  x 10 reps - 5 hold  Manual Percussive device to mid and upper thoracic paraspinals, bilateral upper trap.   DATE:  07/02/24 Manual Percussive device to mid and upper  thoracic paraspinals, bilateral upper trap.   Neuro Re-Ed UBE UE only fwd/back 3 mins each way for muscle activation, mobility.  Lvl 3.5  Prone W's for ER x 10 - hold 3 sec  Prone Scapular Slide with Shoulder Extension  x 10 reps - 3 hold Prone Scapular Retraction Arms at Side  x 10 reps - 5 hold Prone Scapular Retraction Y  x 10 reps - 5 hold Attempted wall angles: x 3 reps, pt reporting increased anterior shoulder pain c approximately 50 deg of shoulder abduction    TODAY'S TREATMENT:                                                                                                       DATE:  06/17/24 Manual Grade 3 GH distraction/inferior glides of pt's Left shoulder Percussive device to mid and upper thoracic paraspinals, bilateral upper trap.   Neuro Re-Ed UBE UE only fwd/back 3 mins each way for muscle activation, mobility.  Lvl 3.5  Prone W's for ER x 10 - hold 5 sec  Prone Scapular Slide with Shoulder Extension  x 10 reps - 5 hold Prone Scapular Retraction Arms at Side  x 10 reps - 5 hold Prone Scapular Retraction Y  x 10 reps - 5 hold Supine cervical retraction 2 x 10 - 5 hold Supine serratus punches  x 10, and x 10 c 2# weight   PATIENT EDUCATION: 05/29/2024 Education details: HEP update Person educated: Patient Education method: Programmer, Multimedia, Demonstration, Verbal cues, and Handouts Education comprehension: verbalized understanding, returned demonstration, and verbal cues required  HOME EXERCISE PROGRAM: Access Code: [email or web address] URL: [email or web address] Date: 06/17/2024 Prepared by: Delon Lunger  Exercises - Standing Shoulder Row with Anchored Resistance  - 2 x daily - 7 x weekly - 2 sets - 15 reps - 5 seconds hold - Doorway Pec Stretch at 90 Degrees Abduction  - 2 x daily - 7 x weekly - 3 sets -  10 reps - Doorway Pec Stretch at 120 Degrees Abduction  - 2 x daily - 7 x weekly - 3 sets - 10 reps - Supine Shoulder Flexion Extension AAROM with Dowel  -  2 x daily - 7 x weekly - 10 reps - Supine Shoulder External Rotation in 45 Degrees Abduction AAROM with Dowel  - 2 x daily - 7 x weekly - 10 reps - Standing Isometric Shoulder Abduction with Doorway - Arm Bent  - 2 x daily - 7 x weekly - 10 reps - 5 seconds hold - Prone Scapular Retraction  - 1 x daily - 7 x weekly - 1 sets - 10 reps - 5 hold - Prone Scapular Slide with Shoulder Extension  - 1 x daily - 7 x weekly - 1 sets - 10 reps - 5 hold - Prone Scapular Retraction Arms at Side  - 1 x daily - 7 x weekly - 1 sets - 10 reps - 5 hold - Prone Scapular Retraction Y  - 1 x daily - 7 x weekly - 1 sets - 10 reps - 5 hold - Prone Scapular Protraction Retraction AROM on Forearms  - 1 x daily - 7 x weekly - 3 sets - 10 reps - 5 hold - Standing Median Nerve Glide  - 2 x daily - 7 x weekly - 10 reps - 3 seconds hold - Supine Scapular Protraction in Flexion with Dumbbells  - 1 x daily - 7 x weekly - 3 sets - 10 reps  ASSESSMENT:  CLINICAL IMPRESSION: Pt needed VC for wall exercises .  Demonstrated understanding.  OBJECTIVE IMPAIRMENTS: decreased mobility, decreased ROM, decreased strength, postural dysfunction, and pain.   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   REHAB POTENTIAL: Excellent  CLINICAL DECISION MAKING: Stable/uncomplicated  EVALUATION COMPLEXITY: Low   GOALS: Goals reviewed with patient? Yes  SHORT TERM GOALS: (target date for Short term goals are 3 weeks 06/04/2024)  1.Patient will demonstrate independent use of home exercise program to maintain progress from in clinic treatments. Goal status: on going 05/29/2024  LONG TERM GOALS: (target dates for all long term goals are 8 weeks  07/12/24 )   1. Patient will demonstrate/report pain at worst less than or equal to 2/10 to facilitate minimal  limitation in daily activity secondary to pain symptoms. Goal status: New   2. Patient will demonstrate independent use of home exercise program to facilitate ability to maintain/progress functional gains from skilled physical therapy services. Goal status: New   3. Patient will demonstrate Patient specific functional scale avg > or = 9.5 to indicate reduced disability due to condition.  Goal status: New   4.  Patient will demonstrate bil UE MMT using HHD by 5 pounds  throughout to facilitate lifting, reaching, carrying at PLOF in daily activity.   Goal status: New   5.  Patient will demonstrate bil shoulder ER to >/= 80 degrees with no pain reported.    Goal status: New   6.  Pt will be able to lift 25# from floor to over head with bil UE's with no pain for returning to lifting his child and work related activities.  Goal status: New     PLAN:  PT FREQUENCY: 1-2x/week for 12 visits if needed  PT DURATION: 8 weeks  PLANNED INTERVENTIONS: Can include 02853- PT Re-evaluation, 97110-Therapeutic exercises,  02469- Therapeutic activity, [id number]- Neuromuscular re-education, [id number]- Self Care, 02859- Manual therapy, [id number]- Gait training, [id number]- Orthotic Fit/training, [id number]- Canalith repositioning, [id number]- Aquatic Therapy, [phone]- Electrical stimulation (unattended), [id number] Physical performance testing, [id number]- Vasopneumatic device, [id number]- Ultrasound, [id number]- Traction (mechanical), [id number]- Ionotophoresis 4mg /ml Dexamethasone,  20560 - Needle insertion w/o injection 1 or 2 muscles, 20561 - Needle insertion w/o injection 3 or more muscles.   Patient/Family education, Balance training, Stair training, Taping, Dry Needling, Joint mobilization, Joint manipulation, Spinal manipulation, Spinal mobilization, Scar mobilization, Vestibular training, Visual/preceptual remediation/compensation, DME instructions, Cryotherapy, and Moist heat.  All performed as medically necessary.  All included unless  contraindicated  PLAN FOR NEXT SESSION:GH joint mobility/strengthening progression,   Burnard Meth, PT 07/09/24  12:26 PM   "

## 2024-07-17 ENCOUNTER — Encounter: Payer: Self-pay | Admitting: Rehabilitative and Restorative Service Providers"

## 2024-07-17 ENCOUNTER — Ambulatory Visit: Admitting: Rehabilitative and Restorative Service Providers"

## 2024-07-17 DIAGNOSIS — G8929 Other chronic pain: Secondary | ICD-10-CM

## 2024-07-17 DIAGNOSIS — R293 Abnormal posture: Secondary | ICD-10-CM

## 2024-07-17 DIAGNOSIS — M25512 Pain in left shoulder: Secondary | ICD-10-CM | POA: Diagnosis not present

## 2024-07-17 DIAGNOSIS — M25511 Pain in right shoulder: Secondary | ICD-10-CM | POA: Diagnosis not present

## 2024-07-17 DIAGNOSIS — M6281 Muscle weakness (generalized): Secondary | ICD-10-CM | POA: Diagnosis not present

## 2024-07-17 NOTE — Therapy (Signed)
 " OUTPATIENT PHYSICAL THERAPY TREATMENT / PROGRESS NOTE/ RECERT   Patient Name: David Hooper MRN: [id number] DOB:[birth year], 24 y.o., male Today's Date: 07/17/2024   Progress Note Reporting Period 05/14/2025  to 07/17/2024  See note below for Objective Data and Assessment of Progress/Goals.      END OF SESSION:  PT End of Session - 07/17/24 1117     Visit Number 6    Number of Visits 17    Date for Recertification  08/28/24    Authorization Type [employer] $25 copay    PT Start Time 1114    PT Stop Time 1140    PT Time Calculation (min) 26 min    Activity Tolerance Patient tolerated treatment well    Behavior During Therapy WFL for tasks assessed/performed              Past Medical History:  Diagnosis Date   Psoriasis    Past Surgical History:  Procedure Laterality Date   CIRCUMCISION     Patient Active Problem List   Diagnosis Date Noted   Chronic pain of both shoulders 04/23/2024   Preventative health care 10/26/2022   Psoriasis 10/26/2022   Obesity (BMI 30-39.9) 10/26/2022   Bilateral impacted cerumen 10/26/2022   Headache 09/05/2013   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    PCP: Wendee Lynwood HERO, NP   REFERRING PROVIDER: Rilla Baller, MD   REFERRING DIAG:  Diagnosis  M25.511,G89.29,M25.512 (ICD-10-CM) - Chronic pain of both shoulders    THERAPY DIAG:  Chronic pain of both shoulders  Muscle weakness (generalized)  Abnormal posture  Rationale for Evaluation and Treatment: Rehabilitation  ONSET DATE: + 6 months  SUBJECTIVE:                                                                                                                                                                                      SUBJECTIVE STATEMENT: Pt indicated feeling similar pain overall in shoulders.  Pt indicated reaching and lifting still trouble and painful.  Pt indicated global rating at +1 at this time.   Attended visit with toddler  PERTINENT HISTORY: Prer  MD note:  Exam suspicious for rotator cuff tendinopathy L>R. Not consistent with shoulder bursitis or glenohumeral tear.  Rec naprosyn  course, ice/heat prn, provided with exercises from [facility] pt advisor on RTC injury.  Recommend limit weight lifting until after physical therapy.  PAIN:  NPRS scale:  pain at worst in last week:  5-6/10 Pain description: achy, sharp at times Aggravating factors: lifting sholders above head and with weights Relieving factors: pain meds, ibprofen  PRECAUTIONS: None  WEIGHT BEARING RESTRICTIONS: no  FALLS:  Has patient fallen in last  6 months? 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 10 represents able to perform at prior level. 0 1 2 3 4 5 6 7 8 9  10 (Date and Score)   Activity Eval  05/14/24 06/17/24  07/17/2024   1. Weight lifting 25# from floor to shoulder height 7   7 7   2. Putting on vest for work 8   7 7   3.      4.     5.     Score 7.5 7 7  avg   Total score = sum of the activity scores/number of activities Minimum detectable change (90%CI) for average score = 2 points Minimum detectable change (90%CI) for single activity score = 3 points  COGNITION: 05/14/24 Overall cognitive status: WFL     SENSATION: 05/14/24 WFL  POSTURE: 05/14/24 Rounded shoulders, forward head  UPPER EXTREMITY ROM:   ROM Right Eval 05/14/24 Left Eval 05/14/24 07/02/24 Rt / Left  Shoulder flexion 170 c mild pain 170 c mild pain 170 / 172 More stiffness noted on Rt  Shoulder extension 65 45    Shoulder abduction 145 pain began at 125 148 pain began at 130 162/160,  pain noted at 65 deg   Shoulder adduction     Shoulder internal rotation     Shoulder external rotation 90 60 c moderate pain 90 / 90   Elbow flexion     Elbow extension     Wrist flexion     Wrist extension     Wrist ulnar deviation     Wrist radial deviation     Wrist pronation     Wrist supination     (Blank rows = not tested)  UPPER EXTREMITY MMT:  MMT Right Eval 05/14/24 HHD in pounds Left Eval 05/14/24 HHD in pounds Rt 07/02/24 HHD in pounds sitting Left 07/02/24 HHD in pounds sitting Right 07/17/2024 Left 07/17/2024  Shoulder flexion 17.8 17.0  14.9 15.3 40.2 40.3 35.2 34.1 4+/5 4+/5  Shoulder extension        Shoulder abduction 11.8 11.6 12.7 11.5 38.9 40.9 36.4 38.4 5/5 4/5  Shoulder adduction        Shoulder internal rotation     5/5 5/5  Shoulder external rotation 21.8 19.7 20.7 23.3 24.8 25.2 30.9 30.2 4/5 4+/5  Middle trapezius        Lower trapezius        Elbow flexion 54.7 54.4      Elbow extension        Wrist flexion        Wrist extension        Wrist ulnar deviation        Wrist radial deviation        Wrist pronation        Wrist supination        Grip strength (lbs)        (Blank rows = not tested)  SHOULDER SPECIAL TESTS: 07/17/2024: (+) pain with empty can bilateral, painful arc. Strength was noted in hold without glaring weakness in testing.  (-)  drop arm bilaterally   05/14/24 Rotator cuff assessment: Empty can test: positive bilateral Biceps assessment: negative bilaterally   PALPATION:  05/29/2024: Thoracic cPA hypomobility mild with pain symptoms T5, T6, T7  05/14/24 TTP; middle deltoid bilaterally, anterior shoulder bilaterally, bilateral upper trap tightness                                                                                                                                                                                                  TODAY'S TREATMENT:                                                                                                       DATE: 07/17/2024 Therex: Verbal review of existing HEP and updated printout.  Additional time talking through activity due to child in room and difficulty in performance of tasks.  Prone y, t x 5 bilaterally with focus on range within pain tolerance.  Side lying ER review.  2 lb with Lt arm to fatigue x 15 Standing ER walk out isometric reactives blue band 5 sec hold x 10 bilaterally     TODAY'S TREATMENT:                                                                                                       DATE: 07/09/24 Neuro Re-Ed UBE UE only fwd/back 3 mins each way for muscle activation, mobility.  No resistance Prone Ts and Vs 2x10 3 sec hold  At wall W's for ER 2x 10 - hold 3 sec  Prone Scapular Slide with Shoulder Extension  x 10 reps - 3 hold Prone Scapular Retraction Arms at Side  x 10 reps - 5 hold  Manual Percussive device to mid and upper thoracic paraspinals, bilateral upper  trap.   TODAY'S TREATMENT:                                                                                                       DATE:07/02/24 Manual Percussive device to mid and upper thoracic paraspinals, bilateral upper trap.   Neuro Re-Ed UBE UE only fwd/back 3 mins each way for muscle activation, mobility.  Lvl 3.5  Prone W's for ER x 10 - hold 3 sec  Prone Scapular Slide with Shoulder Extension  x 10 reps - 3 hold Prone Scapular Retraction Arms at Side  x 10 reps - 5 hold Prone Scapular Retraction Y  x 10 reps - 5 hold Attempted wall angles: x 3 reps, pt reporting increased anterior shoulder pain c approximately 50 deg of shoulder abduction    TODAY'S TREATMENT:                                                                                                       DATE:  06/17/24 Manual Grade 3 GH distraction/inferior glides of pt's Left shoulder Percussive device to mid and  upper thoracic paraspinals, bilateral upper trap.   Neuro Re-Ed UBE UE only fwd/back 3 mins each way for muscle activation, mobility.  Lvl 3.5  Prone W's for ER x 10 - hold 5 sec  Prone Scapular Slide with Shoulder Extension  x 10 reps - 5 hold Prone Scapular Retraction Arms at Side  x 10 reps - 5 hold Prone Scapular Retraction Y  x 10 reps - 5 hold Supine cervical retraction 2 x 10 - 5 hold Supine serratus punches  x 10, and x 10 c 2# weight   PATIENT EDUCATION: 05/29/2024 Education details: HEP update Person educated: Patient Education method: Programmer, Multimedia, Demonstration, Verbal cues, and Handouts Education comprehension: verbalized understanding, returned demonstration, and verbal cues required  HOME EXERCISE PROGRAM: Access Code: [email or web address] URL: [email or web address] Date: 07/17/2024 Prepared by: Ozell Silvan  Exercises - Doorway Pec Stretch at 90 Degrees Abduction  - 2 x daily - 7 x weekly - 3 sets - 10 reps - Supine Scapular Protraction in Flexion with Dumbbells  - 1 x daily - 7 x weekly - 3 sets - 10 reps - Sleeper Stretch  - 1 x daily - 7 x weekly - 3 reps - Prone Single Arm Shoulder Y  - 1 x daily - 5 x weekly - 2-3 sets - 10-15 reps - Prone Shoulder Horizontal Abduction  - 1 x daily - 5 x weekly - 2-3 sets - 10-15 reps - Sidelying Shoulder External Rotation  - 1-2 x daily - 5 x weekly - 2-3 sets - 10-15 reps - Standing Shoulder  Row with Anchored Resistance  - 1-2 x daily - 5 x weekly - 2 sets - 15 reps - 5 seconds hold - Shoulder External Rotation Reactive Isometrics  - 1 x daily - 5 x weekly - 1 sets - 10 reps - 5-15 hold - Shoulder Extension with Resistance  - 1-2 x daily - 5 x weekly - 1-2 sets - 10-15 reps  ASSESSMENT:  CLINICAL IMPRESSION: Pt reported +1 GROC at this time with continued pain complaints in lifting.  Mild strength deficits noted in shoulders as noted in updates.  Reviewed HEP with focus on non painful activity.  Pt may continue to  benefit from tendon loading exercise to help reduce any long term tendon irritations.  Pain was noted with special testing related to rotator cuff integrity but overall strength was there.    Discussed continued HEP with referral back to family MD with possible referral to ortho MD for possible imaging related to shoulders.  Will plan for holding PT visits until any MD follow up.   Reduced treatment time due to arrival time for clinic.   OBJECTIVE IMPAIRMENTS: decreased mobility, decreased ROM, decreased strength, postural dysfunction, and pain.   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   REHAB POTENTIAL: Excellent  CLINICAL DECISION MAKING: Stable/uncomplicated  EVALUATION COMPLEXITY: Low   GOALS: Goals reviewed with patient? Yes  SHORT TERM GOALS: (target date for Short term goals are 3 weeks 06/04/2024)  1.Patient will demonstrate independent use of home exercise program to maintain progress from in clinic treatments. Goal status: Met in review.   LONG TERM GOALS: (target dates for all long term goals are 6 weeks  08/28/2024 )   1. Patient will demonstrate/report pain at worst less than or equal to 2/10 to facilitate minimal limitation in daily activity secondary to pain symptoms. Goal status: revised 07/17/2024   2. Patient will demonstrate independent use of home exercise program to facilitate ability to maintain/progress functional gains from skilled physical therapy services. Goal status: revised 07/17/2024   3. Patient will demonstrate Patient specific functional scale avg > or = 9.5 to indicate reduced disability due to condition.  Goal status: revised 07/17/2024   4.  Patient will demonstrate bil UE MMT using HHD by 5 pounds  throughout to facilitate lifting, reaching, carrying at PLOF in daily  activity.   Goal status: revised 07/17/2024   5.  Patient will demonstrate bil shoulder ER to >/= 80 degrees with no pain reported.    Goal status: revised 07/17/2024   6.  Pt will be able to lift 25# from floor to over head with bil UE's with no pain for returning to lifting his child and work related activities.  Goal status: revised 07/17/2024     PLAN:  PT FREQUENCY:  1-2x/week   PT DURATION: 6 weeks  PLANNED INTERVENTIONS: Can include 02853- PT Re-evaluation, 97110-Therapeutic exercises, 97530- Therapeutic activity, 97112- Neuromuscular re-education, 97535- Self Care, 97140- Manual therapy, [phone]- Gait training, [phone]- Orthotic Fit/training, [phone]- Canalith repositioning, [id number]- Aquatic Therapy, [phone]- Electrical stimulation (unattended), [id number] Physical performance testing, 97016- Vasopneumatic device, [id number]- Ultrasound, [id number]- Traction (mechanical), [id number]- Ionotophoresis 4mg /ml Dexamethasone,  20560 - Needle insertion w/o injection 1 or 2 muscles, 20561 - Needle insertion w/o injection 3 or more muscles.   Patient/Family education, Balance training, Stair training, Taping, Dry Needling, Joint mobilization, Joint manipulation, Spinal manipulation, Spinal  mobilization, Scar mobilization, Vestibular training, Visual/preceptual remediation/compensation, DME instructions, Cryotherapy, and Moist heat.  All performed as medically necessary.  All included unless contraindicated  PLAN FOR NEXT SESSION:GH joint mobility/strengthening progression,    Ozell Silvan, PT, DPT, OCS, ATC 07/17/24  12:03 PM    "
